# Patient Record
Sex: Female | Born: 1935 | Race: White | Hispanic: No | Marital: Married | Smoking: Never smoker
Health system: Southern US, Community
[De-identification: ages and names within clinical notes are randomized; demographics above are authoritative.]

## PROBLEM LIST (undated history)

## (undated) DIAGNOSIS — I509 Heart failure, unspecified: Secondary | ICD-10-CM

## (undated) DIAGNOSIS — C801 Malignant (primary) neoplasm, unspecified: Secondary | ICD-10-CM

## (undated) DIAGNOSIS — I10 Essential (primary) hypertension: Secondary | ICD-10-CM

## (undated) DIAGNOSIS — I219 Acute myocardial infarction, unspecified: Secondary | ICD-10-CM

## (undated) DIAGNOSIS — I4891 Unspecified atrial fibrillation: Secondary | ICD-10-CM

## (undated) HISTORY — PX: CARDIAC SURGERY: SHX584

## (undated) HISTORY — PX: ABDOMINAL HYSTERECTOMY: SHX81

---

## 2005-03-27 ENCOUNTER — Inpatient Hospital Stay: Payer: Self-pay | Admitting: Internal Medicine

## 2005-12-28 ENCOUNTER — Ambulatory Visit: Payer: Self-pay | Admitting: Family Medicine

## 2006-01-04 ENCOUNTER — Ambulatory Visit: Payer: Self-pay | Admitting: Family Medicine

## 2006-02-02 ENCOUNTER — Ambulatory Visit: Payer: Self-pay | Admitting: Ophthalmology

## 2006-02-09 ENCOUNTER — Ambulatory Visit: Payer: Self-pay | Admitting: Ophthalmology

## 2006-02-24 ENCOUNTER — Ambulatory Visit: Payer: Self-pay | Admitting: Ophthalmology

## 2006-03-02 ENCOUNTER — Ambulatory Visit: Payer: Self-pay | Admitting: Ophthalmology

## 2006-06-08 ENCOUNTER — Emergency Department: Payer: Self-pay | Admitting: Emergency Medicine

## 2006-06-08 ENCOUNTER — Other Ambulatory Visit: Payer: Self-pay

## 2009-09-11 ENCOUNTER — Emergency Department: Payer: Self-pay | Admitting: Emergency Medicine

## 2011-02-04 ENCOUNTER — Inpatient Hospital Stay: Payer: Self-pay | Admitting: Internal Medicine

## 2016-03-02 ENCOUNTER — Emergency Department
Admission: EM | Admit: 2016-03-02 | Discharge: 2016-03-02 | Disposition: A | Discharge status: Home / Self Care | Payer: Medicare Other | Attending: Emergency Medicine | Admitting: Emergency Medicine

## 2016-03-02 ENCOUNTER — Encounter: Payer: Self-pay | Admitting: Emergency Medicine

## 2016-03-02 ENCOUNTER — Emergency Department: Payer: Medicare Other

## 2016-03-02 DIAGNOSIS — I2510 Atherosclerotic heart disease of native coronary artery without angina pectoris: Secondary | ICD-10-CM | POA: Diagnosis not present

## 2016-03-02 DIAGNOSIS — R05 Cough: Secondary | ICD-10-CM | POA: Diagnosis present

## 2016-03-02 DIAGNOSIS — Z7982 Long term (current) use of aspirin: Secondary | ICD-10-CM | POA: Insufficient documentation

## 2016-03-02 DIAGNOSIS — J189 Pneumonia, unspecified organism: Secondary | ICD-10-CM | POA: Insufficient documentation

## 2016-03-02 DIAGNOSIS — I10 Essential (primary) hypertension: Secondary | ICD-10-CM | POA: Diagnosis not present

## 2016-03-02 DIAGNOSIS — R112 Nausea with vomiting, unspecified: Secondary | ICD-10-CM | POA: Insufficient documentation

## 2016-03-02 DIAGNOSIS — Z79899 Other long term (current) drug therapy: Secondary | ICD-10-CM | POA: Diagnosis not present

## 2016-03-02 HISTORY — DX: Unspecified atrial fibrillation: I48.91

## 2016-03-02 HISTORY — DX: Essential (primary) hypertension: I10

## 2016-03-02 LAB — CBC WITH DIFFERENTIAL/PLATELET
BASOS ABS: 0.1 10*3/uL (ref 0–0.1)
Basophils Relative: 0 %
EOS PCT: 1 %
Eosinophils Absolute: 0.2 10*3/uL (ref 0–0.7)
HEMATOCRIT: 39.8 % (ref 35.0–47.0)
Hemoglobin: 13.4 g/dL (ref 12.0–16.0)
LYMPHS ABS: 0.6 10*3/uL — AB (ref 1.0–3.6)
LYMPHS PCT: 4 %
MCH: 31.2 pg (ref 26.0–34.0)
MCHC: 33.8 g/dL (ref 32.0–36.0)
MCV: 92.3 fL (ref 80.0–100.0)
MONO ABS: 1 10*3/uL — AB (ref 0.2–0.9)
Monocytes Relative: 6 %
NEUTROS ABS: 14.4 10*3/uL — AB (ref 1.4–6.5)
Neutrophils Relative %: 89 %
PLATELETS: 246 10*3/uL (ref 150–440)
RBC: 4.32 MIL/uL (ref 3.80–5.20)
RDW: 15.3 % — AB (ref 11.5–14.5)
WBC: 16.2 10*3/uL — ABNORMAL HIGH (ref 3.6–11.0)

## 2016-03-02 LAB — BASIC METABOLIC PANEL
ANION GAP: 10 (ref 5–15)
BUN: 36 mg/dL — AB (ref 6–20)
CO2: 23 mmol/L (ref 22–32)
Calcium: 9.5 mg/dL (ref 8.9–10.3)
Chloride: 105 mmol/L (ref 101–111)
Creatinine, Ser: 0.61 mg/dL (ref 0.44–1.00)
GFR calc Af Amer: >60 mL/min (ref >60)
GLUCOSE: 128 mg/dL — AB (ref 65–99)
POTASSIUM: 4.2 mmol/L (ref 3.5–5.1)
Sodium: 138 mmol/L (ref 135–145)

## 2016-03-02 LAB — TROPONIN I
Troponin I: <0.03 ng/mL (ref <0.03)
Troponin I: <0.03 ng/mL (ref <0.03)

## 2016-03-02 MED ORDER — SODIUM CHLORIDE 0.9 % IV BOLUS (SEPSIS)
500.0000 mL | Freq: Once | INTRAVENOUS | Status: AC
  Administered 2016-03-02: 500 mL via INTRAVENOUS

## 2016-03-02 MED ORDER — ONDANSETRON 4 MG PO TBDP: 4.0000 mg | ORAL_TABLET | Freq: Three times a day (TID) | ORAL | 0 refills | Status: DC | PRN

## 2016-03-02 MED ORDER — HYDROCOD POLST-CPM POLST ER 10-8 MG/5ML PO SUER: 5.0000 mL | Freq: Every evening | ORAL | 0 refills | Status: DC | PRN

## 2016-03-02 MED ORDER — HYDROCOD POLST-CPM POLST ER 10-8 MG/5ML PO SUER
5.0000 mL | Freq: Once | ORAL | Status: AC
  Administered 2016-03-02: 5 mL via ORAL
  Filled 2016-03-02: qty 5

## 2016-03-02 MED ORDER — AMOXICILLIN-POT CLAVULANATE 875-125 MG PO TABS
1.0000 | ORAL_TABLET | Freq: Once | ORAL | Status: AC
  Administered 2016-03-02: 1 via ORAL
  Filled 2016-03-02: qty 1

## 2016-03-02 MED ORDER — ONDANSETRON HCL 4 MG/2ML IJ SOLN
4.0000 mg | Freq: Once | INTRAMUSCULAR | Status: AC
  Administered 2016-03-02: 4 mg via INTRAVENOUS
  Filled 2016-03-02: qty 2

## 2016-03-02 MED ORDER — AMOXICILLIN-POT CLAVULANATE 875-125 MG PO TABS: 1.0000 | ORAL_TABLET | Freq: Two times a day (BID) | ORAL | 0 refills | Status: AC

## 2016-03-02 MED ORDER — AZITHROMYCIN 500 MG PO TABS: 500.0000 mg | ORAL_TABLET | Freq: Once | ORAL | Status: DC

## 2016-03-02 NOTE — ED Provider Notes (Signed)
[facility] Emergency Department Provider Note  ____________________________________________  Time seen: Approximately 3:37 PM  I have reviewed the triage vital signs and the nursing notes.   HISTORY  Chief Complaint nausea and Chest Pain   HPI URSELA MORIAN is a 82 y.o. female with a history of atrial fibrillation, hypertension, CADstatus post PCI and stent in 2013 on Plavix who presents for evaluation of nausea and vomiting. Patient reports a few days of a severe cough productive of clear sputum. Today she was coming to the hospital for an echocardiogram this patient has been having dyspnea on exertion for multiple months. On route to the hospital patient started to feel dizzy like she was going to pass out. She also felt nauseous and had several episodes of nonbloody nonbilious emesis. Patient denies having chest pain. She denies CP and does report taking a sublingual nitroglycerin earlier today because she was nauseous. No worsening SOB, no CP, no fever/ chills, no abdominal pain, no dysuria, no diarrhea. Patient was in her usual state of health this morning when she woke up.  Past Medical History:  Diagnosis Date  . A-fib ([place])   . Hypertension     There are no active problems to display for this patient.   Past Surgical History:  Procedure Laterality Date  . ABDOMINAL HYSTERECTOMY      Prior to Admission medications   Medication Sig Start Date End Date Taking? Authorizing Provider  acetaminophen (TYLENOL) 325 MG tablet Take 650 mg by mouth every 6 (six) hours as needed.   Yes Historical Provider, MD  Ascorbic Acid (VITAMIN C) 500 MG CAPS Take 500 mg by mouth daily.   Yes Historical Provider, MD  aspirin 81 MG EC tablet Take 81 mg by mouth daily. 09/09/09  Yes Historical Provider, MD  atorvastatin (LIPITOR) 40 MG tablet Take 40 mg by mouth daily at 6 PM.  02/11/16  Yes Historical Provider, MD  clopidogrel (PLAVIX) 75 MG tablet Take 75 mg by  mouth daily.  02/11/16  Yes Historical Provider, MD  ferrous sulfate 325 (65 FE) MG tablet Take 325 mg by mouth daily with breakfast.   Yes Historical Provider, MD  lisinopril (PRINIVIL,ZESTRIL) 5 MG tablet 5 mg.  02/11/16  Yes Historical Provider, MD  metoprolol tartrate (LOPRESSOR) 25 MG tablet 25 mg 2 (two) times daily.  02/11/16  Yes Historical Provider, MD  pantoprazole (PROTONIX) 40 MG tablet  02/11/16  Yes Historical Provider, MD  vitamin E 400 UNIT capsule Take 400 Units by mouth daily.   Yes Historical Provider, MD  amoxicillin-clavulanate (AUGMENTIN) 875-125 MG tablet Take 1 tablet by mouth 2 (two) times daily. 03/02/16 03/12/16  Rudene Re, MD  chlorpheniramine-HYDROcodone [facility] ER) 10-8 MG/5ML SUER Take 5 mLs by mouth at bedtime as needed for cough. 03/02/16   Rudene Re, MD  ondansetron (ZOFRAN ODT) 4 MG disintegrating tablet Take 1 tablet (4 mg total) by mouth every 8 (eight) hours as needed for nausea or vomiting. 03/02/16   Rudene Re, MD    Allergies Demerol [meperidine]  No family history on file.  Social History Social History  Substance Use Topics  . Smoking status: Never Smoker  . Smokeless tobacco: Never Used  . Alcohol use No    Review of Systems  Constitutional: Negative for fever. Eyes: Negative for visual changes. ENT: Negative for sore throat. Neck: No neck pain  Cardiovascular: Negative for chest pain. Respiratory: + shortness of breath and cough Gastrointestinal: Negative for abdominal pain,  Diarrhea. + N/V Genitourinary: Negative for dysuria. Musculoskeletal: Negative for back pain. Skin: Negative for rash. Neurological: Negative for headaches, weakness or numbness. Psych: No SI or HI  ____________________________________________   PHYSICAL EXAM:  VITAL SIGNS: ED Triage Vitals  Enc Vitals Group     BP 03/02/16 1505 (!) 153/94     Pulse Rate 03/02/16 1505 78     Resp 03/02/16 1505 20     Temp 03/02/16  1505 98.4 F (36.9 C)     Temp Source 03/02/16 1505 Oral     SpO2 03/02/16 1505 93 %     Weight 03/02/16 1502 111 lb (50.3 kg)     Height 03/02/16 1502 4\' 8"  (1.422 m)     Head Circumference --      Peak Flow --      Pain Score 03/02/16 1502 0     Pain Loc --      Pain Edu? --      Excl. in [place]? --     Constitutional: Alert and oriented, patient wilt coughing fits, bringing up clear sputum.  HEENT:      Head: Normocephalic and atraumatic.         Eyes: Conjunctivae are normal. Sclera is non-icteric. EOMI. PERRL      Mouth/Throat: Mucous membranes are moist.       Neck: Supple with no signs of meningismus. Cardiovascular: Irregularly irregular rhythm with normal rate. No murmurs, gallops, or rubs. 2+ symmetrical distal pulses are present in all extremities. No JVD. Respiratory: Normal respiratory effort. Lungs are clear to auscultation bilaterally. No wheezes, crackles, or rhonchi.  Gastrointestinal: Soft, non tender, and non distended with positive bowel sounds. No rebound or guarding. Musculoskeletal: Nontender with normal range of motion in all extremities. No edema, cyanosis, or erythema of extremities. Neurologic: Normal speech and language. Face is symmetric. Moving all extremities. No gross focal neurologic deficits are appreciated. Skin: Skin is warm, dry and intact. No rash noted. Psychiatric: Mood and affect are normal. Speech and behavior are normal.  ____________________________________________   LABS (all labs ordered are listed, but only abnormal results are displayed)  Labs Reviewed  CBC WITH DIFFERENTIAL/PLATELET - Abnormal; Notable for the following:       Result Value   WBC 16.2 (*)    RDW 15.3 (*)    Neutro Abs 14.4 (*)    Lymphs Abs 0.6 (*)    Monocytes Absolute 1.0 (*)    All other components within normal limits  BASIC METABOLIC PANEL - Abnormal; Notable for the following:    Glucose, Bld 128 (*)    BUN 36 (*)    All other components within normal  limits  TROPONIN I  TROPONIN I  URINALYSIS COMPLETEWITH MICROSCOPIC ([facility] ONLY)   ____________________________________________  EKG  ED ECG REPORT I, Rudene Re, the attending physician, personally viewed and interpreted this ECG.  Atrial fibrillation, rate of 80, prolonged QTC at 507, right bundle branch block, T-wave inversions on inferior leads, no ST elevations. Unchanged from prior.  ____________________________________________  RADIOLOGY  CXR: Cardiomegaly without pulmonary edema. No pneumonia or pleural effusion or other acute cardiopulmonary abnormality.  Thoracic aortic atherosclerosis. ____________________________________________   PROCEDURES  Procedure(s) performed: None Procedures Critical Care performed:  None ____________________________________________   INITIAL IMPRESSION / ASSESSMENT AND PLAN / ED COURSE  82 y.o. female with a history of atrial fibrillation, hypertension, CADstatus post PCI and stent in 2013 on Plavix who presents for evaluation of nausea and vomiting since earlier today in the  setting of cough productive of clear sputum for a few weeks. Patient is having multiple coughing fits in the room but otherwise her lungs are clear, she is in A. fib with well-controlled rate, normal work of breathing, normal sats, abdomen is soft and nontender. EKG is unchanged from prior. We'll give her Tussionex for the cough, get a chest x-ray to rule out pneumonia, give her Zofran and fluids for the nausea and vomiting, check blood work and urinalysis.  Clinical Course as of Mar 02 1910  Tue Mar 02, 2016  1719 Nurse called because patient was hypoxic requiring 3L [place]. I went to evaluate patient. Lungs remain clear, I repositioned her in bed and patient sats are normal on RA. Patient was removed from supplemental oxygen. CXR clear however with cough and elevated WBC patient was started on augmentin for possible CA-PNA. 2nd troponin is due at 18:15. Plan to dc  home on augmentin, tussionex, and zofran  [CV]    Clinical Course User Index [CV] Rudene Re, MD    Pertinent labs & imaging results that were available during my care of the patient were reviewed by me and considered in my medical decision making (see chart for details).    ____________________________________________   FINAL CLINICAL IMPRESSION(S) / ED DIAGNOSES  Final diagnoses:  Community acquired pneumonia, unspecified laterality      NEW MEDICATIONS STARTED DURING THIS VISIT:  New Prescriptions   AMOXICILLIN-CLAVULANATE (AUGMENTIN) 875-125 MG TABLET    Take 1 tablet by mouth 2 (two) times daily.   CHLORPHENIRAMINE-HYDROCODONE (TUSSIONEX PENNKINETIC ER) 10-8 MG/5ML SUER    Take 5 mLs by mouth at bedtime as needed for cough.   ONDANSETRON (ZOFRAN ODT) 4 MG DISINTEGRATING TABLET    Take 1 tablet (4 mg total) by mouth every 8 (eight) hours as needed for nausea or vomiting.     Note:  This document was prepared using Dragon voice recognition software and may include unintentional dictation errors.    Rudene Re, MD 03/02/16 [phone]

## 2016-03-02 NOTE — ED Triage Notes (Signed)
Pt with c/o "feeling funny in her chest", denies any chest pain at present, pt states she feels dizzy and lightheaded.

## 2016-03-02 NOTE — ED Notes (Signed)
Pt desat'd to 86% - placed on 2L of O2 and brought to 88% - increased O2 to 3L via n/c and o2 sats 94-95% - Dr Alfred Levins notified - pt denies feeling short of breath - lung sounds are clear - cough relieved by medication - denies chest pain/pressure/tightness

## 2016-03-02 NOTE — ED Notes (Signed)
Pt came to hospital today for echo but got shaky and began to vomit so they would not do the test - pt has croupy cough since 11am and c/o nausea at same time - pt denies chest pain - reports shortness of breath with any exertion over the last few months - MD at bedside evaluating pt

## 2016-03-02 NOTE — Discharge Instructions (Signed)

## 2016-03-11 ENCOUNTER — Encounter: Payer: Self-pay | Admitting: Emergency Medicine

## 2016-03-11 ENCOUNTER — Emergency Department: Payer: Medicare Other

## 2016-03-11 ENCOUNTER — Emergency Department
Admission: EM | Admit: 2016-03-11 | Discharge: 2016-03-11 | Disposition: A | Discharge status: Home / Self Care | Payer: Medicare Other | Attending: Emergency Medicine | Admitting: Emergency Medicine

## 2016-03-11 DIAGNOSIS — R079 Chest pain, unspecified: Secondary | ICD-10-CM | POA: Diagnosis not present

## 2016-03-11 DIAGNOSIS — I10 Essential (primary) hypertension: Secondary | ICD-10-CM | POA: Insufficient documentation

## 2016-03-11 DIAGNOSIS — Z79899 Other long term (current) drug therapy: Secondary | ICD-10-CM | POA: Insufficient documentation

## 2016-03-11 DIAGNOSIS — R0602 Shortness of breath: Secondary | ICD-10-CM | POA: Diagnosis present

## 2016-03-11 DIAGNOSIS — Z7982 Long term (current) use of aspirin: Secondary | ICD-10-CM | POA: Diagnosis not present

## 2016-03-11 LAB — CBC WITH DIFFERENTIAL/PLATELET
BASOS PCT: 0 %
Basophils Absolute: 0.1 10*3/uL (ref 0–0.1)
EOS ABS: 0.1 10*3/uL (ref 0–0.7)
EOS PCT: 1 %
HCT: 39.5 % (ref 35.0–47.0)
HEMOGLOBIN: 13.2 g/dL (ref 12.0–16.0)
LYMPHS ABS: 0.6 10*3/uL — AB (ref 1.0–3.6)
Lymphocytes Relative: 4 %
MCH: 31.2 pg (ref 26.0–34.0)
MCHC: 33.4 g/dL (ref 32.0–36.0)
MCV: 93.5 fL (ref 80.0–100.0)
MONO ABS: 1.8 10*3/uL — AB (ref 0.2–0.9)
MONOS PCT: 13 %
Neutro Abs: 11.4 10*3/uL — ABNORMAL HIGH (ref 1.4–6.5)
Neutrophils Relative %: 82 %
PLATELETS: 262 10*3/uL (ref 150–440)
RBC: 4.22 MIL/uL (ref 3.80–5.20)
RDW: 14.7 % — AB (ref 11.5–14.5)
WBC: 14 10*3/uL — ABNORMAL HIGH (ref 3.6–11.0)

## 2016-03-11 LAB — BASIC METABOLIC PANEL
Anion gap: 9 (ref 5–15)
BUN: 23 mg/dL — AB (ref 6–20)
CALCIUM: 9.1 mg/dL (ref 8.9–10.3)
CHLORIDE: 100 mmol/L — AB (ref 101–111)
CO2: 27 mmol/L (ref 22–32)
CREATININE: 0.61 mg/dL (ref 0.44–1.00)
GFR calc Af Amer: >60 mL/min (ref >60)
GFR calc non Af Amer: >60 mL/min (ref >60)
Glucose, Bld: 119 mg/dL — ABNORMAL HIGH (ref 65–99)
Potassium: 4.1 mmol/L (ref 3.5–5.1)
SODIUM: 136 mmol/L (ref 135–145)

## 2016-03-11 LAB — TROPONIN I: Troponin I: <0.03 ng/mL (ref <0.03)

## 2016-03-11 LAB — BRAIN NATRIURETIC PEPTIDE: B Natriuretic Peptide: 336 pg/mL — ABNORMAL HIGH (ref 0.0–100.0)

## 2016-03-11 MED ORDER — FUROSEMIDE 10 MG/ML IJ SOLN
20.0000 mg | Freq: Once | INTRAMUSCULAR | Status: AC
  Administered 2016-03-11: 20 mg via INTRAVENOUS
  Filled 2016-03-11: qty 4

## 2016-03-11 MED ORDER — FUROSEMIDE 20 MG PO TABS: 20.0000 mg | ORAL_TABLET | Freq: Every day | ORAL | 0 refills | Status: AC

## 2016-03-11 NOTE — ED Notes (Signed)
Daughter has left bedside, states she will return

## 2016-03-11 NOTE — ED Notes (Signed)
Pt was seen last week for poss pneumonia, given antibiotics. Pt  c/o rib cage pain radiating to back on inspiration. Pt A&Ox4, denies any fevers.

## 2016-03-11 NOTE — ED Provider Notes (Signed)
[facility] Emergency Department Provider Note   ____________________________________________   I have reviewed the triage vital signs and the nursing notes.   HISTORY  Chief Complaint Chest Pain and Shortness of Breath   History limited by: Not Limited   HPI Mallory Rios is a 82 y.o. female who presents to the emergency department today via EMS because of concern for chest pain. It is located on the left side. It started a little over 12 hours ago and has been fairly constant. She describes it as sharp. There is radiation to her back. She tried taking some nitroglycerin without any significant relief. In addition the patient has a history of atrial fibrillation and feels like it was acting up today. She did have some shortness of breath when that occurred. The patient was seen in the emergency department roughly 10 days ago and diagnosed with pneumonia.   Past Medical History:  Diagnosis Date  . A-fib ([place])   . Hypertension     There are no active problems to display for this patient.   Past Surgical History:  Procedure Laterality Date  . ABDOMINAL HYSTERECTOMY      Prior to Admission medications   Medication Sig Start Date End Date Taking? Authorizing Provider  acetaminophen (TYLENOL) 325 MG tablet Take 650 mg by mouth every 6 (six) hours as needed.    Historical Provider, MD  amoxicillin-clavulanate (AUGMENTIN) 875-125 MG tablet Take 1 tablet by mouth 2 (two) times daily. 03/02/16 03/12/16  Rudene Re, MD  Ascorbic Acid (VITAMIN C) 500 MG CAPS Take 500 mg by mouth daily.    Historical Provider, MD  aspirin 81 MG EC tablet Take 81 mg by mouth daily. 09/09/09   Historical Provider, MD  atorvastatin (LIPITOR) 40 MG tablet Take 40 mg by mouth daily at 6 PM.  02/11/16   Historical Provider, MD  chlorpheniramine-HYDROcodone (TUSSIONEX PENNKINETIC ER) 10-8 MG/5ML SUER Take 5 mLs by mouth at bedtime as needed for cough. 03/02/16   Rudene Re, MD  clopidogrel (PLAVIX) 75 MG tablet Take 75 mg by mouth daily.  02/11/16   Historical Provider, MD  ferrous sulfate 325 (65 FE) MG tablet Take 325 mg by mouth daily with breakfast.    Historical Provider, MD  lisinopril (PRINIVIL,ZESTRIL) 5 MG tablet 5 mg.  02/11/16   Historical Provider, MD  metoprolol tartrate (LOPRESSOR) 25 MG tablet 25 mg 2 (two) times daily.  02/11/16   Historical Provider, MD  ondansetron (ZOFRAN ODT) 4 MG disintegrating tablet Take 1 tablet (4 mg total) by mouth every 8 (eight) hours as needed for nausea or vomiting. 03/02/16   Rudene Re, MD  pantoprazole (PROTONIX) 40 MG tablet  02/11/16   Historical Provider, MD  vitamin E 400 UNIT capsule Take 400 Units by mouth daily.    Historical Provider, MD    Allergies Demerol [meperidine]  No family history on file.  Social History Social History  Substance Use Topics  . Smoking status: Never Smoker  . Smokeless tobacco: Never Used  . Alcohol use No    Review of Systems  Constitutional: Negative for fever. Cardiovascular: Negative for chest pain. Respiratory: Negative for shortness of breath. Gastrointestinal: Negative for abdominal pain, vomiting and diarrhea. Genitourinary: Negative for dysuria. Musculoskeletal: Negative for back pain. Skin: Negative for rash. Neurological: Negative for headaches, focal weakness or numbness.  10-point ROS otherwise negative.  ____________________________________________   PHYSICAL EXAM:  VITAL SIGNS: ED Triage Vitals  Enc Vitals Group     BP  03/11/16 1518 (!) 127/107     Pulse Rate 03/11/16 1518 86     Resp 03/11/16 1518 (!) 24     Temp 03/11/16 1518 98.3 F (36.8 C)     Temp Source 03/11/16 1518 Oral     SpO2 03/11/16 1518 95 %     Weight 03/11/16 1519 111 lb (50.3 kg)     Height 03/11/16 1519 4\' 11"  (1.499 m)     Head Circumference --      Peak Flow --      Pain Score 03/11/16 1516 8     Pain Loc --      Pain Edu? --      Excl. in [place]?  --      Constitutional: Alert and oriented. Well appearing and in no distress. Eyes: Conjunctivae are normal. Normal extraocular movements. ENT   Head: Normocephalic and atraumatic.   Nose: No congestion/rhinnorhea.   Mouth/Throat: Mucous membranes are moist.   Neck: No stridor. Hematological/Lymphatic/Immunilogical: No cervical lymphadenopathy. Cardiovascular: Normal rate, regular rhythm.  No murmurs, rubs, or gallops.  Respiratory: Normal respiratory effort without tachypnea nor retractions. Breath sounds are clear and equal bilaterally. No wheezes/rales/rhonchi. Gastrointestinal: Soft and non tender. No rebound. No guarding.  Genitourinary: Deferred Musculoskeletal: Normal range of motion in all extremities. No lower extremity edema. Neurologic:  Normal speech and language. No gross focal neurologic deficits are appreciated.  Skin:  Skin is warm, dry and intact. No rash noted. Psychiatric: Mood and affect are normal. Speech and behavior are normal. Patient exhibits appropriate insight and judgment.  ____________________________________________    LABS (pertinent positives/negatives)  Labs Reviewed  CBC WITH DIFFERENTIAL/PLATELET - Abnormal; Notable for the following:       Result Value   WBC 14.0 (*)    RDW 14.7 (*)    Neutro Abs 11.4 (*)    Lymphs Abs 0.6 (*)    Monocytes Absolute 1.8 (*)    All other components within normal limits  BASIC METABOLIC PANEL - Abnormal; Notable for the following:    Chloride 100 (*)    Glucose, Bld 119 (*)    BUN 23 (*)    All other components within normal limits  BRAIN NATRIURETIC PEPTIDE - Abnormal; Notable for the following:    B Natriuretic Peptide 336.0 (*)    All other components within normal limits  TROPONIN I  TROPONIN I    ____________________________________________   EKG  I, Nance Pear, attending physician, personally viewed and interpreted this EKG  EKG Time: 1518 Rate: 95 Rhythm: atrial  fibrillation Axis: right axis deviation Intervals: qtc 472 QRS: nonspecific intraventricular conduction delay ST changes: no st elevation Impression: abnormal ekg   ____________________________________________    RADIOLOGY  CXR   IMPRESSION:  1. Cardiomegaly.  2. Slightly more prominent interstitial markings. Question mild  interstitial edema with questionable small left effusion.  3. Hiatal hernia.    ____________________________________________   PROCEDURES  Procedures  ____________________________________________   INITIAL IMPRESSION / ASSESSMENT AND PLAN / ED COURSE  Pertinent labs & imaging results that were available during my care of the patient were reviewed by me and considered in my medical decision making (see chart for details).  Patient presented to the emergency department today because of concerns for chest pain. 2 troponins here were negative. Chest x-ray shows some possible pulmonary edema. Patient was given a dose of IV Lasix here. Patient did state that she felt a little bit better on reassessment after the Lasix. The patient stated she  felt comfortable going home. Discussed that we will give prescription for lasix for the next couple of days. Encouraged patient to follow up with PCP.  ____________________________________________   FINAL CLINICAL IMPRESSION(S) / ED DIAGNOSES  Final diagnoses:  Chest pain, unspecified type     Note: This dictation was prepared with Dragon dictation. Any transcriptional errors that result from this process are unintentional     Nance Pear, MD 03/11/16 1939

## 2016-03-11 NOTE — ED Notes (Signed)
ED Provider at bedside. 

## 2016-03-11 NOTE — Discharge Instructions (Signed)
Please seek medical attention for any high fevers, chest pain, shortness of breath, change in behavior, persistent vomiting, bloody stool or any other new or concerning symptoms.  

## 2016-06-01 ENCOUNTER — Inpatient Hospital Stay
Admission: EM | Admit: 2016-06-01 | Discharge: 2016-06-05 | DRG: 293 | Disposition: A | Discharge status: Home / Self Care | Payer: Medicare Other | Attending: Internal Medicine | Admitting: Internal Medicine

## 2016-06-01 ENCOUNTER — Emergency Department: Payer: Medicare Other

## 2016-06-01 ENCOUNTER — Encounter: Payer: Self-pay | Admitting: Emergency Medicine

## 2016-06-01 ENCOUNTER — Inpatient Hospital Stay: Admit: 2016-06-01 | Payer: Medicare Other

## 2016-06-01 DIAGNOSIS — I959 Hypotension, unspecified: Secondary | ICD-10-CM | POA: Diagnosis present

## 2016-06-01 DIAGNOSIS — Z955 Presence of coronary angioplasty implant and graft: Secondary | ICD-10-CM

## 2016-06-01 DIAGNOSIS — Z90710 Acquired absence of both cervix and uterus: Secondary | ICD-10-CM | POA: Diagnosis not present

## 2016-06-01 DIAGNOSIS — I2510 Atherosclerotic heart disease of native coronary artery without angina pectoris: Secondary | ICD-10-CM | POA: Diagnosis present

## 2016-06-01 DIAGNOSIS — R002 Palpitations: Secondary | ICD-10-CM | POA: Diagnosis present

## 2016-06-01 DIAGNOSIS — Z79899 Other long term (current) drug therapy: Secondary | ICD-10-CM

## 2016-06-01 DIAGNOSIS — I509 Heart failure, unspecified: Secondary | ICD-10-CM | POA: Diagnosis present

## 2016-06-01 DIAGNOSIS — I252 Old myocardial infarction: Secondary | ICD-10-CM

## 2016-06-01 DIAGNOSIS — I5033 Acute on chronic diastolic (congestive) heart failure: Secondary | ICD-10-CM | POA: Diagnosis present

## 2016-06-01 DIAGNOSIS — I083 Combined rheumatic disorders of mitral, aortic and tricuspid valves: Secondary | ICD-10-CM | POA: Diagnosis present

## 2016-06-01 DIAGNOSIS — K219 Gastro-esophageal reflux disease without esophagitis: Secondary | ICD-10-CM | POA: Diagnosis present

## 2016-06-01 DIAGNOSIS — I4891 Unspecified atrial fibrillation: Secondary | ICD-10-CM | POA: Diagnosis present

## 2016-06-01 DIAGNOSIS — Z7982 Long term (current) use of aspirin: Secondary | ICD-10-CM

## 2016-06-01 DIAGNOSIS — I110 Hypertensive heart disease with heart failure: Secondary | ICD-10-CM | POA: Diagnosis present

## 2016-06-01 DIAGNOSIS — Z888 Allergy status to other drugs, medicaments and biological substances status: Secondary | ICD-10-CM | POA: Diagnosis not present

## 2016-06-01 HISTORY — DX: Acute myocardial infarction, unspecified: I21.9

## 2016-06-01 HISTORY — DX: Malignant (primary) neoplasm, unspecified: C80.1

## 2016-06-01 LAB — BASIC METABOLIC PANEL
Anion gap: 8 (ref 5–15)
BUN: 27 mg/dL — AB (ref 6–20)
CHLORIDE: 106 mmol/L (ref 101–111)
CO2: 25 mmol/L (ref 22–32)
CREATININE: 0.71 mg/dL (ref 0.44–1.00)
Calcium: 8.9 mg/dL (ref 8.9–10.3)
Glucose, Bld: 105 mg/dL — ABNORMAL HIGH (ref 65–99)
Potassium: 4.6 mmol/L (ref 3.5–5.1)
SODIUM: 139 mmol/L (ref 135–145)

## 2016-06-01 LAB — TROPONIN I
Troponin I: <0.03 ng/mL (ref <0.03)
Troponin I: <0.03 ng/mL (ref <0.03)

## 2016-06-01 LAB — CBC
HCT: 36.6 % (ref 35.0–47.0)
Hemoglobin: 12.1 g/dL (ref 12.0–16.0)
MCH: 30 pg (ref 26.0–34.0)
MCHC: 33.2 g/dL (ref 32.0–36.0)
MCV: 90.4 fL (ref 80.0–100.0)
PLATELETS: 250 10*3/uL (ref 150–440)
RBC: 4.05 MIL/uL (ref 3.80–5.20)
RDW: 16.5 % — AB (ref 11.5–14.5)
WBC: 9.2 10*3/uL (ref 3.6–11.0)

## 2016-06-01 LAB — GLUCOSE, CAPILLARY: GLUCOSE-CAPILLARY: 116 mg/dL — AB (ref 65–99)

## 2016-06-01 LAB — BRAIN NATRIURETIC PEPTIDE: B Natriuretic Peptide: 441 pg/mL — ABNORMAL HIGH (ref 0.0–100.0)

## 2016-06-01 MED ORDER — FUROSEMIDE 10 MG/ML IJ SOLN
40.0000 mg | Freq: Two times a day (BID) | INTRAMUSCULAR | Status: DC
  Administered 2016-06-01 – 2016-06-02 (×2): 40 mg via INTRAVENOUS
  Filled 2016-06-01 (×2): qty 4

## 2016-06-01 MED ORDER — LISINOPRIL 5 MG PO TABS
5.0000 mg | ORAL_TABLET | Freq: Every day | ORAL | Status: DC
  Administered 2016-06-01 – 2016-06-02 (×2): 5 mg via ORAL
  Filled 2016-06-01 (×2): qty 1

## 2016-06-01 MED ORDER — ENOXAPARIN SODIUM 40 MG/0.4ML ~~LOC~~ SOLN
40.0000 mg | SUBCUTANEOUS | Status: DC
  Administered 2016-06-01 – 2016-06-04 (×4): 40 mg via SUBCUTANEOUS
  Filled 2016-06-01 (×4): qty 0.4

## 2016-06-01 MED ORDER — CLOPIDOGREL BISULFATE 75 MG PO TABS
75.0000 mg | ORAL_TABLET | Freq: Every day | ORAL | Status: DC
  Administered 2016-06-01 – 2016-06-05 (×5): 75 mg via ORAL
  Filled 2016-06-01 (×5): qty 1

## 2016-06-01 MED ORDER — SODIUM CHLORIDE 0.9 % IV SOLN: 250.0000 mL | INTRAVENOUS | Status: DC | PRN

## 2016-06-01 MED ORDER — ASPIRIN 81 MG PO TBEC: 81.0000 mg | DELAYED_RELEASE_TABLET | Freq: Every day | ORAL | Status: DC

## 2016-06-01 MED ORDER — ASPIRIN EC 81 MG PO TBEC
81.0000 mg | DELAYED_RELEASE_TABLET | Freq: Every day | ORAL | Status: DC
Start: 2016-06-01 — End: 2016-06-05
  Administered 2016-06-01 – 2016-06-05 (×5): 81 mg via ORAL
  Filled 2016-06-01 (×5): qty 1

## 2016-06-01 MED ORDER — ACETAMINOPHEN 500 MG PO TABS
1000.0000 mg | ORAL_TABLET | Freq: Two times a day (BID) | ORAL | Status: DC
  Administered 2016-06-02 – 2016-06-05 (×7): 1000 mg via ORAL
  Filled 2016-06-01 (×8): qty 2

## 2016-06-01 MED ORDER — FAMOTIDINE 20 MG PO TABS
20.0000 mg | ORAL_TABLET | Freq: Every day | ORAL | Status: DC
  Administered 2016-06-01 – 2016-06-04 (×4): 20 mg via ORAL
  Filled 2016-06-01 (×4): qty 1

## 2016-06-01 MED ORDER — ATORVASTATIN CALCIUM 20 MG PO TABS
40.0000 mg | ORAL_TABLET | Freq: Every day | ORAL | Status: DC
  Administered 2016-06-01 – 2016-06-04 (×4): 40 mg via ORAL
  Filled 2016-06-01 (×4): qty 2

## 2016-06-01 MED ORDER — SODIUM CHLORIDE 0.9% FLUSH
3.0000 mL | Freq: Two times a day (BID) | INTRAVENOUS | Status: DC
  Administered 2016-06-01 – 2016-06-05 (×9): 3 mL via INTRAVENOUS

## 2016-06-01 MED ORDER — VITAMIN E 180 MG (400 UNIT) PO CAPS
400.0000 [IU] | ORAL_CAPSULE | Freq: Every day | ORAL | Status: DC
  Administered 2016-06-01 – 2016-06-05 (×5): 400 [IU] via ORAL
  Filled 2016-06-01 (×5): qty 1

## 2016-06-01 MED ORDER — FERROUS SULFATE 325 (65 FE) MG PO TABS
325.0000 mg | ORAL_TABLET | Freq: Every day | ORAL | Status: DC
  Administered 2016-06-04 – 2016-06-05 (×2): 325 mg via ORAL
  Filled 2016-06-01 (×5): qty 1

## 2016-06-01 MED ORDER — VITAMIN C 500 MG PO TABS
500.0000 mg | ORAL_TABLET | Freq: Every day | ORAL | Status: DC
  Administered 2016-06-01 – 2016-06-05 (×5): 500 mg via ORAL
  Filled 2016-06-01 (×5): qty 1

## 2016-06-01 MED ORDER — ONDANSETRON HCL 4 MG/2ML IJ SOLN: 4.0000 mg | Freq: Four times a day (QID) | INTRAMUSCULAR | Status: DC | PRN

## 2016-06-01 MED ORDER — ACETAMINOPHEN 325 MG PO TABS
650.0000 mg | ORAL_TABLET | ORAL | Status: DC | PRN
  Administered 2016-06-01: 650 mg via ORAL
  Filled 2016-06-01: qty 2

## 2016-06-01 MED ORDER — ACETAMINOPHEN 325 MG PO TABS
650.0000 mg | ORAL_TABLET | Freq: Two times a day (BID) | ORAL | Status: DC
  Administered 2016-06-01: 650 mg via ORAL
  Filled 2016-06-01: qty 2

## 2016-06-01 MED ORDER — NITROGLYCERIN 0.4 MG SL SUBL
0.4000 mg | SUBLINGUAL_TABLET | SUBLINGUAL | Status: DC | PRN
Start: 2016-06-01 — End: 2016-06-05

## 2016-06-01 MED ORDER — PANTOPRAZOLE SODIUM 40 MG PO TBEC
40.0000 mg | DELAYED_RELEASE_TABLET | Freq: Every day | ORAL | Status: DC
  Administered 2016-06-01: 40 mg via ORAL
  Filled 2016-06-01: qty 1

## 2016-06-01 MED ORDER — METOPROLOL TARTRATE 25 MG PO TABS
25.0000 mg | ORAL_TABLET | Freq: Two times a day (BID) | ORAL | Status: DC
  Administered 2016-06-01 – 2016-06-02 (×3): 25 mg via ORAL
  Filled 2016-06-01 (×3): qty 1

## 2016-06-01 MED ORDER — SODIUM CHLORIDE 0.9% FLUSH: 3.0000 mL | INTRAVENOUS | Status: DC | PRN

## 2016-06-01 MED ORDER — FUROSEMIDE 10 MG/ML IJ SOLN
40.0000 mg | Freq: Once | INTRAMUSCULAR | Status: AC
  Administered 2016-06-01: 40 mg via INTRAVENOUS
  Filled 2016-06-01: qty 4

## 2016-06-01 MED ORDER — PANTOPRAZOLE SODIUM 40 MG PO TBEC
40.0000 mg | DELAYED_RELEASE_TABLET | Freq: Two times a day (BID) | ORAL | Status: DC
  Administered 2016-06-01 – 2016-06-05 (×8): 40 mg via ORAL
  Filled 2016-06-01 (×8): qty 1

## 2016-06-01 NOTE — ED Notes (Signed)
Admitting md at bedside

## 2016-06-01 NOTE — Clinical Social Work Note (Signed)
CSW received consult that patient may need SNF for short term rehab.  CSW awaiting PT evaluation and recommendation.  Jones Broom. [place], MSW, [place]  06/01/2016 12:18 PM

## 2016-06-01 NOTE — Progress Notes (Signed)
RR increased to 44 with activity of getting up to the commode directly next to her bed.  O2 sats at 94%

## 2016-06-01 NOTE — Progress Notes (Signed)
[place] [facility] [place] NAME: Mallory Rios    MR#:  [id number]  DATE OF BIRTH:  06-08-1927  SUBJECTIVE:    REVIEW OF SYSTEMS:   ROS Tolerating Diet: Tolerating PT:   DRUG ALLERGIES:   Allergies  Allergen Reactions  . Demerol [Meperidine]   . Other Other (See Comments)  . Propoxyphene     VITALS:  Blood pressure (!) 124/50, pulse 75, temperature 98.4 F (36.9 C), temperature source Oral, resp. rate 16, height 4\' 8"  (1.422 m), weight 53 kg (116 lb 14.4 oz), SpO2 93 %.  PHYSICAL EXAMINATION:   Physical Exam  GENERAL:  83 y.o.-year-old patient lying in the bed with no acute distress.  EYES: Pupils equal, round, reactive to light and accommodation. No scleral icterus. Extraocular muscles intact.  HEENT: Head atraumatic, normocephalic. Oropharynx and nasopharynx clear.  NECK:  Supple, no jugular venous distention. No thyroid enlargement, no tenderness.  LUNGS: Normal breath sounds bilaterally, no wheezing, rales, rhonchi. No use of accessory muscles of respiration.  CARDIOVASCULAR: S1, S2 normal. No murmurs, rubs, or gallops.  ABDOMEN: Soft, nontender, nondistended. Bowel sounds present. No organomegaly or mass.  EXTREMITIES: No cyanosis, clubbing or edema b/l.    NEUROLOGIC: Cranial nerves II through XII are intact. No focal Motor or sensory deficits b/l.   PSYCHIATRIC:  patient is alert and oriented x 3.  SKIN: No obvious rash, lesion, or ulcer.   LABORATORY PANEL:  CBC  Recent Labs Lab 06/01/16 0319  WBC 9.2  HGB 12.1  HCT 36.6  PLT 250    Chemistries   Recent Labs Lab 06/01/16 0319  NA 139  K 4.6  CL 106  CO2 25  GLUCOSE 105*  BUN 27*  CREATININE 0.71  CALCIUM 8.9   Cardiac Enzymes  Recent Labs Lab 06/01/16 1336  TROPONINI <0.03   RADIOLOGY:  Dg Chest 2 View  Result Date: 06/01/2016 CLINICAL DATA:  Palpitations and dyspnea tonight. EXAM: CHEST  2 VIEW COMPARISON:  03/11/2016 FINDINGS:  Stable cardiomegaly and hyperinflation. There are small pleural effusions bilaterally, new. Mild interstitial prominence may represent a degree of interstitial edema. No confluent airspace opacities. Hiatal hernia noted. IMPRESSION: Stable hyperinflation and cardiomegaly, with superimposed interstitial edema and small pleural effusions. Electronically Signed   By: Andreas Newport M.D.   On: 06/01/2016 03:53   ASSESSMENT AND PLAN:  83 year old elderly female patient with history of atrial fibrillation, hypertension, coronary artery disease, myocardial infarction presented to the emergency room with increased shortness of breath and swelling in both lower extremities. Chest x-ray revealed vascular congestion and edema.  1. Acute congestive heart failure -ECHO pending. EF unknown -IV lasix, I and O and monitor creat  2. Dyspnea secondary to heart failure  3. Atrial fibrillation, chornic appears to be stable -cont BB  4. Hypertension On BB, lisinopril  5. H/o CAD  Cont plavix and statins  6. GERD  -PPI bid and zantac at bedtime  Case discussed with Care Management/Social Worker. Management plans discussed with the patient, family and they are in agreement.  CODE STATUS: Full  DVT Prophylaxis: lovenox TOTAL TIME TAKING CARE OF THIS PATIENT:30 minutes.  >50% time spent on counselling and coordination of care  POSSIBLE D/C IN 1-2DAYS, DEPENDING ON CLINICAL CONDITION.  Note: This dictation was prepared with Dragon dictation along with smaller phrase technology. Any transcriptional errors that result from this process are unintentional.  Makalya Nave M.D on 06/01/2016 at 5:44 PM  Between 7am to 6pm -  Pager - [phone]  After 6pm go to [email or web address] - password [email or web address] [facility]  Office  [phone]  CC: Primary care physician; No PCP Per Patient

## 2016-06-01 NOTE — ED Provider Notes (Signed)
[facility] Emergency Department Provider Note  ____________________________________________   First MD Initiated Contact with Patient 06/01/16 [phone]     (approximate)  I have reviewed the triage vital signs and the nursing notes.   HISTORY  Chief Complaint Atrial Fibrillation   HPI Mallory Rios is a 83 y.o. female with a history of myocardial infarction as well as atrial fibrillation was presenting to the emergency department today with 1 week of worsening shortness of breath and bilateral lower extremity swelling. She is also complaining of palpitations which were ongoing throughout the course of the day today. She took her metoprolol later than normal at 2 AM this morning and says that the palpitations have resolved but the shortness of breath and bilateral lower extremity swelling has persisted. Denying any chest pain at this time.   Past Medical History:  Diagnosis Date  . A-fib ([place])   . Cancer ([place])   . Hypertension   . MI (myocardial infarction)     There are no active problems to display for this patient.   Past Surgical History:  Procedure Laterality Date  . ABDOMINAL HYSTERECTOMY    . CARDIAC SURGERY     stents x6    Prior to Admission medications   Medication Sig Start Date End Date Taking? Authorizing Provider  acetaminophen (TYLENOL) 325 MG tablet Take 650 mg by mouth every 6 (six) hours as needed.    Historical Provider, MD  Ascorbic Acid (VITAMIN C) 500 MG CAPS Take 500 mg by mouth daily.    Historical Provider, MD  aspirin 81 MG EC tablet Take 81 mg by mouth daily. 09/09/09   Historical Provider, MD  atorvastatin (LIPITOR) 40 MG tablet Take 40 mg by mouth daily at 6 PM.  02/11/16   Historical Provider, MD  chlorpheniramine-HYDROcodone (TUSSIONEX PENNKINETIC ER) 10-8 MG/5ML SUER Take 5 mLs by mouth at bedtime as needed for cough. Patient not taking: Reported on 03/11/2016 03/02/16   Rudene Re, MD  clopidogrel (PLAVIX)  75 MG tablet Take 75 mg by mouth daily.  02/11/16   Historical Provider, MD  ferrous sulfate 325 (65 FE) MG tablet Take 325 mg by mouth daily with breakfast.    Historical Provider, MD  furosemide (LASIX) 20 MG tablet Take 1 tablet (20 mg total) by mouth daily. 03/11/16 03/11/17  Nance Pear, MD  lisinopril (PRINIVIL,ZESTRIL) 5 MG tablet 5 mg.  02/11/16   Historical Provider, MD  metoprolol tartrate (LOPRESSOR) 25 MG tablet 25 mg 2 (two) times daily.  02/11/16   Historical Provider, MD  ondansetron (ZOFRAN ODT) 4 MG disintegrating tablet Take 1 tablet (4 mg total) by mouth every 8 (eight) hours as needed for nausea or vomiting. 03/02/16   Rudene Re, MD  pantoprazole (PROTONIX) 40 MG tablet Take 40 mg by mouth daily.  02/11/16   Historical Provider, MD  vitamin E 400 UNIT capsule Take 400 Units by mouth daily.    Historical Provider, MD    Allergies Demerol [meperidine] and Propoxyphene  No family history on file.  Social History Social History  Substance Use Topics  . Smoking status: Never Smoker  . Smokeless tobacco: Never Used  . Alcohol use No    Review of Systems Constitutional: No fever/chills Eyes: No visual changes. ENT: No sore throat. Cardiovascular: Denies chest pain. Respiratory: as above Gastrointestinal: No abdominal pain.  No nausea, no vomiting.   Genitourinary: Negative for dysuria. Musculoskeletal: Negative for back pain. Skin: Negative for rash. Neurological: Negative for headaches, focal  weakness or numbness.  10-point ROS otherwise negative.  ____________________________________________   PHYSICAL EXAM:  VITAL SIGNS: ED Triage Vitals  Enc Vitals Group     BP 06/01/16 0316 131/89     Pulse Rate 06/01/16 0316 (!) 115     Resp 06/01/16 0316 (!) 36     Temp 06/01/16 0317 97.9 F (36.6 C)     Temp src --      SpO2 06/01/16 0316 93 %     Weight 06/01/16 0317 120 lb 9.5 oz (54.7 kg)     Height 06/01/16 0317 4\' 8"  (1.422 m)     Head  Circumference --      Peak Flow --      Pain Score --      Pain Loc --      Pain Edu? --      Excl. in [place]? --     Constitutional: Alert and oriented. Well appearing and in no acute distress. Eyes: Conjunctivae are normal. PERRL. EOMI. Head: Atraumatic. Nose: No congestion/rhinnorhea. Mouth/Throat: Mucous membranes are moist.  Oropharynx non-erythematous. Neck: No stridor.   Cardiovascular: Normal rate with an irregularly irregular rhythm. Grossly normal heart sounds.  Good peripheral circulation. Respiratory: Tachypnea but able to speak in full sentences. Rales to the bilateral bases. No retractions. Gastrointestinal: Soft and nontender. No distention.  Musculoskeletal: Mild to moderate bilateral lower extremity edema to the bilateral feet and calves. Neurologic:  Normal speech and language. No gross focal neurologic deficits are appreciated.  Skin:  Skin is warm, dry and intact. No rash noted. Psychiatric: Mood and affect are normal. Speech and behavior are normal.  ____________________________________________   LABS (all labs ordered are listed, but only abnormal results are displayed)  Labs Reviewed  BASIC METABOLIC PANEL - Abnormal; Notable for the following:       Result Value   Glucose, Bld 105 (*)    BUN 27 (*)    All other components within normal limits  CBC - Abnormal; Notable for the following:    RDW 16.5 (*)    All other components within normal limits  BRAIN NATRIURETIC PEPTIDE - Abnormal; Notable for the following:    B Natriuretic Peptide 441.0 (*)    All other components within normal limits  GLUCOSE, CAPILLARY - Abnormal; Notable for the following:    Glucose-Capillary 116 (*)    All other components within normal limits  TROPONIN I  CBG MONITORING, ED   ____________________________________________  EKG  ED ECG REPORT I, Doran Stabler, the attending physician, personally viewed and interpreted this ECG.   Date: 06/01/2016  EKG Time: 0315   Rate: 86  Rhythm: atrial fibrillation, rate 86  Axis: Normal  Intervals: Incomplete right bundle-branch block.  ST&T Change: No ST segment elevation or depression. No abnormal T-wave inversions. No significant change from previous EKG on the record from 03/11/2016. ____________________________________________  RADIOLOGY    DG Chest 2 View (Final result)  Result time 06/01/16 03:53:38  Final result by Delphina Cahill, MD (06/01/16 03:53:38)           Narrative:   CLINICAL DATA: Palpitations and dyspnea tonight.  EXAM: CHEST 2 VIEW  COMPARISON: 03/11/2016  FINDINGS: Stable cardiomegaly and hyperinflation. There are small pleural effusions bilaterally, new. Mild interstitial prominence may represent a degree of interstitial edema. No confluent airspace opacities. Hiatal hernia noted.  IMPRESSION: Stable hyperinflation and cardiomegaly, with superimposed interstitial edema and small pleural effusions.   Electronically Signed By: Andreas Newport M.D. On:  06/01/2016 03:53          ____________________________________________   PROCEDURES  Procedure(s) performed:   Procedures  Critical Care performed:   ____________________________________________   INITIAL IMPRESSION / ASSESSMENT AND PLAN / ED COURSE  Pertinent labs & imaging results that were available during my care of the patient were reviewed by me and considered in my medical decision making (see chart for details).  ----------------------------------------- 4:47 AM on 06/01/2016 -----------------------------------------  Patient says that her breathing is improved but still breathing a respiratory rate of 30. Will be admitted to the hospital for further diuresis. A splint is to the patient and she is understanding and willing to comply. Signed out to Dr. Tracie Harrier.       ____________________________________________   FINAL CLINICAL IMPRESSION(S) / ED DIAGNOSES  Shortness of breath  and CHF exacerbation.    NEW MEDICATIONS STARTED DURING THIS VISIT:  New Prescriptions   No medications on file     Note:  This document was prepared using Dragon voice recognition software and may include unintentional dictation errors.    Orbie Pyo, MD 06/01/16 [phone]

## 2016-06-01 NOTE — Care Management (Addendum)
Received consult for home health needs. PT is pending. Met with patient at bedside. She states she lives at home with her spouse. Uses a walker. Reports one recent falls. She has sever scoliosis and states she can walk to the kitchen table but is limited otherwise.She is not on O2 at home. Calls a family friend to help her get to appointments. PCP is " Dr. Jenetta Downer"  at [facility]  When ask about home health vs. SNF she states she would have to speak with her husband. Will follow progression and assist as needed.

## 2016-06-01 NOTE — H&P (Addendum)
[name] NAME: Mallory Rios    MR#:  [id number]  DATE OF BIRTH:  09-04-1927  DATE OF ADMISSION:  06/01/2016  PRIMARY CARE PHYSICIAN: No PCP Per Patient   REQUESTING/REFERRING PHYSICIAN:   CHIEF COMPLAINT:   Chief Complaint  Patient presents with  . Atrial Fibrillation    HISTORY OF PRESENT ILLNESS: Mallory Rios  is a 83 y.o. female with a known history of Atrial fibrillation, hypertension, myocardial infarction presented to the emergency room palpitations and difficulty breathing. Patient felt short of breath for the last 1 week and does not use any home oxygen. Yesterday she had a lot of palpitations and she took her metoprolol and then felt better. She also noticed increased swelling in both the lower legs for the last 1 week. Complains of orthopnea. No complaints of any chest pain. Patient was evaluated in the emergency room chest x-ray showed a fluid overload, congestion and edema. She was given IV Lasix in the emergency room for diuresis. Hospitalist service was consulted for further care of the patient. No complaints of any fever, chills and cough.  PAST MEDICAL HISTORY:   Past Medical History:  Diagnosis Date  . A-fib ([place])   . Cancer ([place])   . Hypertension   . MI (myocardial infarction)     PAST SURGICAL HISTORY: Past Surgical History:  Procedure Laterality Date  . ABDOMINAL HYSTERECTOMY    . CARDIAC SURGERY     stents x6    SOCIAL HISTORY:  Social History  Substance Use Topics  . Smoking status: Never Smoker  . Smokeless tobacco: Never Used  . Alcohol use No    FAMILY HISTORY:  Family History  Problem Relation Age of Onset  . CAD Neg Hx   . Diabetes Neg Hx   . Hypertension Neg Hx     DRUG ALLERGIES:  Allergies  Allergen Reactions  . Demerol [Meperidine]   . Other Other (See Comments)  . Propoxyphene     REVIEW OF SYSTEMS:   CONSTITUTIONAL: No fever, fatigue or weakness.  EYES:  No blurred or double vision.  EARS, NOSE, AND THROAT: No tinnitus or ear pain.  RESPIRATORY: No cough, wheezing or hemoptysis.  Has shortness of breath. CARDIOVASCULAR: No chest pain, has orthopnea, edema.  GASTROINTESTINAL: No nausea, vomiting, diarrhea or abdominal pain.  GENITOURINARY: No dysuria, hematuria.  ENDOCRINE: No polyuria, nocturia,  HEMATOLOGY: No anemia, easy bruising or bleeding SKIN: No rash or lesion. MUSCULOSKELETAL: No joint pain or arthritis.   NEUROLOGIC: No tingling, numbness, weakness.  PSYCHIATRY: No anxiety or depression.   MEDICATIONS AT HOME:  Prior to Admission medications   Medication Sig Start Date End Date Taking? Authorizing Provider  atorvastatin (LIPITOR) 40 MG tablet Take 40 mg by mouth daily at 6 PM.  02/11/16  Yes Historical Provider, MD  clopidogrel (PLAVIX) 75 MG tablet Take 75 mg by mouth daily.  02/11/16  Yes Historical Provider, MD  lisinopril (PRINIVIL,ZESTRIL) 5 MG tablet 5 mg.  02/11/16  Yes Historical Provider, MD  metoprolol tartrate (LOPRESSOR) 25 MG tablet 25 mg 2 (two) times daily.  02/11/16  Yes Historical Provider, MD  nitroGLYCERIN (NITROSTAT) 0.4 MG SL tablet Place 0.4 mg under the tongue every 5 (five) minutes as needed for chest pain.   Yes Historical Provider, MD  pantoprazole (PROTONIX) 40 MG tablet Take 40 mg by mouth daily.  02/11/16  Yes Historical Provider, MD  acetaminophen (TYLENOL) 325 MG tablet Take 650 mg by  mouth every 6 (six) hours as needed.    Historical Provider, MD  Ascorbic Acid (VITAMIN C) 500 MG CAPS Take 500 mg by mouth daily.    Historical Provider, MD  aspirin 81 MG EC tablet Take 81 mg by mouth daily. 09/09/09   Historical Provider, MD  chlorpheniramine-HYDROcodone (TUSSIONEX PENNKINETIC ER) 10-8 MG/5ML SUER Take 5 mLs by mouth at bedtime as needed for cough. Patient not taking: Reported on 03/11/2016 03/02/16   Rudene Re, MD  ferrous sulfate 325 (65 FE) MG tablet Take 325 mg by mouth daily with  breakfast.    Historical Provider, MD  furosemide (LASIX) 20 MG tablet Take 1 tablet (20 mg total) by mouth daily. Patient not taking: Reported on 06/01/2016 03/11/16 03/11/17  Nance Pear, MD  ondansetron (ZOFRAN ODT) 4 MG disintegrating tablet Take 1 tablet (4 mg total) by mouth every 8 (eight) hours as needed for nausea or vomiting. Patient not taking: Reported on 06/01/2016 03/02/16   Rudene Re, MD  vitamin E 400 UNIT capsule Take 400 Units by mouth daily.    Historical Provider, MD      PHYSICAL EXAMINATION:   VITAL SIGNS: Blood pressure 135/62, pulse 67, temperature 97.9 F (36.6 C), resp. rate 18, height 4\' 8"  (1.422 m), weight 54.7 kg (120 lb 9.5 oz), SpO2 96 %.  GENERAL:  83 y.o.-year-old patient lying in the bed in mild respiratory distress.  EYES: Pupils equal, round, reactive to light and accommodation. No scleral icterus. Extraocular muscles intact.  HEENT: Head atraumatic, normocephalic. Oropharynx and nasopharynx clear.  NECK:  Supple, no jugular venous distention. No thyroid enlargement, no tenderness.  LUNGS: decreased breath sounds bilaterally, bibasilar crepitations heard. No use of accessory muscles of respiration.  CARDIOVASCULAR: S1, S2 normal. No murmurs, rubs, or gallops.  ABDOMEN: Soft, nontender, nondistended. Bowel sounds present. No organomegaly or mass.  EXTREMITIES: Has pedal edema,  No cyanosis, or clubbing.  NEUROLOGIC: Cranial nerves II through XII are intact. Muscle strength 5/5 in all extremities. Sensation intact. Gait not checked.  PSYCHIATRIC: The patient is alert and oriented x 3.  SKIN: No obvious rash, lesion, or ulcer.   LABORATORY PANEL:   CBC  Recent Labs Lab 06/01/16 0319  WBC 9.2  HGB 12.1  HCT 36.6  PLT 250  MCV 90.4  MCH 30.0  MCHC 33.2  RDW 16.5*   ------------------------------------------------------------------------------------------------------------------  Chemistries   Recent Labs Lab 06/01/16 0319  NA  139  K 4.6  CL 106  CO2 25  GLUCOSE 105*  BUN 27*  CREATININE 0.71  CALCIUM 8.9   ------------------------------------------------------------------------------------------------------------------ estimated creatinine clearance is 34.8 mL/min (by C-G formula based on SCr of 0.71 mg/dL). ------------------------------------------------------------------------------------------------------------------ No results for input(s): TSH, T4TOTAL, T3FREE, THYROIDAB in the last 72 hours.  Invalid input(s): FREET3   Coagulation profile No results for input(s): INR, PROTIME in the last 168 hours. ------------------------------------------------------------------------------------------------------------------- No results for input(s): DDIMER in the last 72 hours. -------------------------------------------------------------------------------------------------------------------  Cardiac Enzymes  Recent Labs Lab 06/01/16 0319  TROPONINI <0.03   ------------------------------------------------------------------------------------------------------------------ Invalid input(s): POCBNP  ---------------------------------------------------------------------------------------------------------------  Urinalysis No results found for: COLORURINE, APPEARANCEUR, LABSPEC, PHURINE, GLUCOSEU, HGBUR, BILIRUBINUR, KETONESUR, PROTEINUR, UROBILINOGEN, NITRITE, LEUKOCYTESUR   RADIOLOGY: Dg Chest 2 View  Result Date: 06/01/2016 CLINICAL DATA:  Palpitations and dyspnea tonight. EXAM: CHEST  2 VIEW COMPARISON:  03/11/2016 FINDINGS: Stable cardiomegaly and hyperinflation. There are small pleural effusions bilaterally, new. Mild interstitial prominence may represent a degree of interstitial edema. No confluent airspace opacities. Hiatal hernia noted. IMPRESSION: Stable hyperinflation and cardiomegaly,  with superimposed interstitial edema and small pleural effusions. Electronically Signed   By: Andreas Newport  M.D.   On: 06/01/2016 03:53    EKG: Orders placed or performed during the hospital encounter of 06/01/16  . ED EKG within 10 minutes  . ED EKG within 10 minutes  . EKG 12-Lead  . EKG 12-Lead    IMPRESSION AND PLAN: 83 year old elderly female patient with history of atrial fibrillation, hypertension, coronary artery disease, myocardial infarction presented to the emergency room with increased shortness of breath and swelling in both lower extremities. Chest x-ray revealed vascular congestion and edema. Admitting diagnosis 1. Acute congestive heart failure 2. Dyspnea secondary to heart failure 3. Atrial fibrillation 4. Hypertension Treatment plan Admit patient to telemetry Diurese patient with IV Lasix 40 MG every 12 hourly Continue metoprolol for rate control Continue ACE inhibitor for hypertension DVT prophylaxis with subcutaneous Lovenox 40 MG daily Cycle troponin to rule out ischemia Check echocardiogram Low-salt diet Oxygen via nasal cannula Supportive care.  All the records are reviewed and case discussed with ED provider. Management plans discussed with the patient, family and they are in agreement.  CODE STATUS:FULL CODE Surrogate decision maker : Husband Code Status History    This patient does not have a recorded code status. Please follow your organizational policy for patients in this situation.       TOTAL TIME TAKING CARE OF THIS PATIENT: 51 minutes.    Saundra Shelling M.D on 06/01/2016 at 5:49 AM  Between 7am to 6pm - Pager - [phone]  After 6pm go to [email or web address] - password [email or web address] [facility]  [facility]  Office  [phone]  CC: Primary care physician; No PCP Per Patient

## 2016-06-01 NOTE — Progress Notes (Addendum)
Patient has a coaching type cough after drinking. She's says this has been a problem for about a week.  Speech evaluation ordered.  Stopped all liquids.  She's eaten all of her breakfast without difficulty except for the liquids. Will Give her meds with applesauce.

## 2016-06-01 NOTE — ED Notes (Signed)
Report to [place], Therapist, sports. Encouraged to call with questions

## 2016-06-01 NOTE — Progress Notes (Signed)
PT Cancellation Note  Patient Details Name: Mallory Rios MRN: [id number] DOB: [birth year]   Cancelled Treatment:    Reason Eval/Treat Not Completed: Medical issues which prohibited therapy. Complains she is very SOB with any mobility and would prefer to wait until tomorrow.  Will check in the AM.   Ramond Dial 06/01/2016, 1:58 PM   Mee Hives, PT MS Acute Rehab Dept. Number: [place] [facility] [place]

## 2016-06-01 NOTE — Evaluation (Signed)
Clinical/Bedside Swallow Evaluation Patient Details  Name: Mallory Rios MRN: [id number] Date of Birth: 1927-11-28  Today's Date: 06/01/2016 Time: SLP Start Time (ACUTE ONLY): 1600 SLP Stop Time (ACUTE ONLY): 1700 SLP Time Calculation (min) (ACUTE ONLY): 60 min  Past Medical History:  Past Medical History:  Diagnosis Date  . A-fib (Mallory Rios)   . Cancer (Mallory Rios)   . Hypertension   . MI (myocardial infarction)    Past Surgical History:  Past Surgical History:  Procedure Laterality Date  . ABDOMINAL HYSTERECTOMY    . CARDIAC SURGERY     stents x6   HPI:  Pt is a 82 y.o. female with a known history of Acid Reflux w/ moderate phlegm and on PPI for several years, atrial fibrillation, hypertension, myocardial infarction, scoliosis, and Hiatal Hernia presented to the emergency room palpitations and difficulty breathing. Patient felt short of breath for the last 1 week and does not use any home oxygen. Yesterday she had a lot of palpitations and she took her metoprolol and then felt better. She also noticed increased swelling in both the lower legs for the last 1 week. Complains of orthopnea. No complaints of any chest pain. Patient was evaluated in the emergency room chest x-ray showed a fluid overload, congestion and edema. She was given IV Lasix in the emergency room for diuresis. Per chart review, pt does have a notation of Hiatal Hernia per CXRs, as well as significant acid reflux on PPI, both not noted in her H&P. Pt indicated significant impact from the acid reflux at home. NSG had concern of pt's swallowing w/ thin liquids earlier today when attempting to swallowing pills. Pt has been swallowing pills w/ Applesauce appropriately since.    Assessment / Plan / Recommendation Clinical Impression  Pt appears to present w/ adequate oropharyngeal phase swallow function and at reduced risk for aspiration w/ oral intake from an oropharyngeal phase standpoint when following general aspiration  precautions and taking her time to avoid any SOB/WOB. Pt appeared to present w/ no gross oropharyngeal phase dysphagia during po trials; no overt s/s of aspiration noted w/ trials of thin liquids VIA CUP, and adequate bolus management and oral clearing noted w/ all consistencies. Pt required min extra time w/ po trials to avoid increased exertion but moreso to allow time for Esophageal clearing as pt has (baseline) significant Esophageal phase dysphagia, dysmotility, and retrograde activity as reported by pt. Pt stated the "acid reflux" has "come back up in my throat during the night", and she has difficulty coughing/clearing the Esophageal/pharyngeal phlegm she experiences in the mornings - suspect related from the Reflux during the night. Recommend a Dysphagia level 3 diet w/ thin liquids via CUP initially w/ general aspiration precautions; STRICT REFLUX PRECAUTIONS; continue PPI and consult MD re: for need to adjust for better Esophageal/Reflux management; consider GI consult; Pills swallowed w/ puree/Applesauce; rest breaks during meals and frequent, smaller meals to lessen overly full feelings. Recommend f/u w/ ENT for assessment of vocal cords secondary to Dysphonia currently.  SLP Visit Diagnosis: Dysphagia, pharyngoesophageal phase (R13.14) (Esophageal phase dysmotility)    Aspiration Risk  Mild aspiration risk (from Esophageal dysmotility, Reflux)    Diet Recommendation  Dysphagia level 3(cut/chopped meats); thin liquids. Added purees and soups in diet for easier Esophageal phase motility/toleration. General aspiration precautions; STRICT REFLUX PRECAUTIONS.  Medication Administration: Whole meds with puree    Other  Recommendations Recommended Consults: Consider GI evaluation;Consider esophageal assessment (for better management; Dietician consult) Oral Care Recommendations: Oral care BID;Staff/trained  caregiver to provide oral care   Follow up Recommendations None      Frequency and  Duration min 2x/week  1 week       Prognosis Prognosis for Safe Diet Advancement: Good Barriers to Reach Goals:  (deconditioned; Reflux baseline)      Swallow Study   General Date of Onset: 06/01/16 HPI: Pt is a 83 y.o. female with a known history of Acid Reflux w/ moderate phlegm and on PPI for several years, atrial fibrillation, hypertension, myocardial infarction, scoliosis, and Hiatal Hernia presented to the emergency room palpitations and difficulty breathing. Patient felt short of breath for the last 1 week and does not use any home oxygen. Yesterday she had a lot of palpitations and she took her metoprolol and then felt better. She also noticed increased swelling in both the lower legs for the last 1 week. Complains of orthopnea. No complaints of any chest pain. Patient was evaluated in the emergency room chest x-ray showed a fluid overload, congestion and edema. She was given IV Lasix in the emergency room for diuresis. Per chart review, pt does have a notation of Hiatal Hernia per CXRs, as well as significant acid reflux on PPI, both not noted in her H&P. Pt indicated significant impact from the acid reflux at home. NSG had concern of pt's swallowing w/ thin liquids earlier today when attempting to swallowing pills. Pt has been swallowing pills w/ Applesauce appropriately since.  Type of Study: Bedside Swallow Evaluation Previous Swallow Assessment: none reported Diet Prior to this Study: Regular;Thin liquids ("soft foods") Temperature Spikes Noted: No (wbc 9.2) Respiratory Status: Nasal cannula (2 liters) History of Recent Intubation: No Behavior/Cognition: Alert;Cooperative;Pleasant mood Oral Cavity Assessment: Within Functional Limits Oral Care Completed by SLP: Recent completion by staff Oral Cavity - Dentition: Adequate natural dentition;Missing dentition Vision: Functional for self-feeding Self-Feeding Abilities: Able to feed self;Needs set up Patient Positioning: Upright in  bed (though has scoliosis which impacts her positioning some) Baseline Vocal Quality: Breathy;Hoarse (Dysphonia - concern for aspiration of Reflux) Volitional Cough: Congested (min; adequate) Volitional Swallow: Able to elicit    Oral/Motor/Sensory Function Overall Oral Motor/Sensory Function: Within functional limits   Ice Chips Ice chips: Not tested   Thin Liquid Thin Liquid: Within functional limits Presentation: Cup;Self Fed (8 trials) Other Comments: took single sips slowly to avoid any increased WOB/SOB from impact of exertion    Nectar Thick Nectar Thick Liquid: Not tested   Honey Thick Honey Thick Liquid: Not tested   Puree Puree: Within functional limits Presentation: Self Fed;Spoon (3 trials)   Solid   GO   Solid: Not tested Other Comments: pt had finished lunch meal and declined         Orinda Kenner, MS, CCC-SLP Gina Costilla 06/01/2016,5:55 PM

## 2016-06-01 NOTE — ED Notes (Addendum)
Pt assist on bedpanx2 for urination. No complications. Pt rolls without assistance.

## 2016-06-01 NOTE — ED Triage Notes (Signed)
Per acems; pt called out d/t palpitations, did not take prescribed metoprolol today. palpitations began, took meds, converted to normal sinus in route. VSS. 115 cbg. Hx chf, PNA in December. Recent cough with clear-pink tinge.  Presenting with blat swollen feet. Crackles present

## 2016-06-02 ENCOUNTER — Inpatient Hospital Stay
Admit: 2016-06-02 | Discharge: 2016-06-02 | Disposition: A | Discharge status: Home / Self Care | Payer: Medicare Other | Attending: Internal Medicine | Admitting: Internal Medicine

## 2016-06-02 LAB — BASIC METABOLIC PANEL
ANION GAP: 9 (ref 5–15)
BUN: 26 mg/dL — ABNORMAL HIGH (ref 6–20)
CHLORIDE: 100 mmol/L — AB (ref 101–111)
CO2: 32 mmol/L (ref 22–32)
Calcium: 9.3 mg/dL (ref 8.9–10.3)
Creatinine, Ser: 0.48 mg/dL (ref 0.44–1.00)
GFR calc Af Amer: >60 mL/min (ref >60)
GLUCOSE: 104 mg/dL — AB (ref 65–99)
POTASSIUM: 4 mmol/L (ref 3.5–5.1)
SODIUM: 141 mmol/L (ref 135–145)

## 2016-06-02 LAB — ECHOCARDIOGRAM COMPLETE
Height: 56 in
WEIGHTICAEL: 1860.8 [oz_av]

## 2016-06-02 MED ORDER — SODIUM CHLORIDE 0.9 % IV BOLUS (SEPSIS)
250.0000 mL | Freq: Once | INTRAVENOUS | Status: AC
  Administered 2016-06-02: 250 mL via INTRAVENOUS

## 2016-06-02 MED ORDER — FUROSEMIDE 10 MG/ML IJ SOLN: 20.0000 mg | Freq: Two times a day (BID) | INTRAMUSCULAR | Status: DC

## 2016-06-02 NOTE — Progress Notes (Signed)
*  PRELIMINARY RESULTS* Echocardiogram 2D Echocardiogram has been performed.  Mallory Rios 06/02/2016, 8:22 AM

## 2016-06-02 NOTE — Progress Notes (Signed)
NOtified Dr. Darvin Neighbours of bp 80/43. A bolus of ns ordered.

## 2016-06-02 NOTE — Progress Notes (Signed)
DR Manuella Ghazi was informed about pt bp in the 70's , order for 250 ml ns bolus to be given , will continue to monitor

## 2016-06-02 NOTE — Progress Notes (Signed)
[facility] [place] NAME: Mallory Rios    MR#:  [id number]  DATE OF BIRTH:  December 23, 1927  SUBJECTIVE:  Feels tired and short of breath.  Blood pressure has been running low making it difficult to give her any of her heart medication REVIEW OF SYSTEMS:   Review of Systems  Constitutional: Positive for malaise/fatigue. Negative for chills, fever and weight loss.  HENT: Negative for nosebleeds and sore throat.   Eyes: Negative for blurred vision.  Respiratory: Positive for shortness of breath. Negative for cough and wheezing.   Cardiovascular: Positive for palpitations. Negative for chest pain, orthopnea, leg swelling and PND.  Gastrointestinal: Negative for abdominal pain, constipation, diarrhea, heartburn, nausea and vomiting.  Genitourinary: Negative for dysuria and urgency.  Musculoskeletal: Negative for back pain.  Skin: Negative for rash.  Neurological: Positive for weakness. Negative for dizziness, speech change, focal weakness and headaches.  Endo/Heme/Allergies: Does not bruise/bleed easily.  Psychiatric/Behavioral: Negative for depression.   Tolerating Diet: Tolerating PT:   DRUG ALLERGIES:   Allergies  Allergen Reactions  . Demerol [Meperidine]   . Other Other (See Comments)  . Propoxyphene     VITALS:  Blood pressure (!) 95/58, pulse 76, temperature 97.6 F (36.4 C), temperature source Oral, resp. rate 18, height 4\' 8"  (1.422 m), weight 52.8 kg (116 lb 4.8 oz), SpO2 98 %. PHYSICAL EXAMINATION:   Physical Exam  Constitutional: She is oriented to person, place, and time and well-developed, well-nourished, and in no distress.  HENT:  Head: Normocephalic and atraumatic.  Eyes: Conjunctivae and EOM are normal. Pupils are equal, round, and reactive to light.  Neck: Normal range of motion. Neck supple. No tracheal deviation present. No thyromegaly present.  Cardiovascular: Normal rate, regular rhythm and normal  heart sounds.   Pulmonary/Chest: Effort normal and breath sounds normal. No respiratory distress. She has no wheezes. She exhibits no tenderness.  Abdominal: Soft. Bowel sounds are normal. She exhibits no distension. There is no tenderness.  Musculoskeletal: Normal range of motion.  Neurological: She is alert and oriented to person, place, and time. No cranial nerve deficit.  Skin: Skin is warm and dry. No rash noted.  Psychiatric: Mood and affect normal.   LABORATORY PANEL:  CBC  Recent Labs Lab 06/01/16 0319  WBC 9.2  HGB 12.1  HCT 36.6  PLT 250    Chemistries   Recent Labs Lab 06/02/16 0548  NA 141  K 4.0  CL 100*  CO2 32  GLUCOSE 104*  BUN 26*  CREATININE 0.48  CALCIUM 9.3   Cardiac Enzymes  Recent Labs Lab 06/01/16 2111  TROPONINI <0.03   RADIOLOGY:  Dg Chest 2 View  Result Date: 06/01/2016 CLINICAL DATA:  Palpitations and dyspnea tonight. EXAM: CHEST  2 VIEW COMPARISON:  03/11/2016 FINDINGS: Stable cardiomegaly and hyperinflation. There are small pleural effusions bilaterally, new. Mild interstitial prominence may represent a degree of interstitial edema. No confluent airspace opacities. Hiatal hernia noted. IMPRESSION: Stable hyperinflation and cardiomegaly, with superimposed interstitial edema and small pleural effusions. Electronically Signed   By: Andreas Newport M.D.   On: 06/01/2016 03:53   ASSESSMENT AND PLAN:  83 year old elderly female patient with history of atrial fibrillation, hypertension, coronary artery disease, myocardial infarction presented to the emergency room with increased shortness of breath and swelling in both lower extremities. Chest x-ray revealed vascular congestion and edema.  1. Acute diastolic congestive heart failure -ECHO shows EF of 50-55% -Strict I and O, daily weights -  Cardiology consultation   *Hypotension with a history of hypertension  We will hold metoprolol lisinopril and Lasix as well  - give 250 cc of bolus one  time considering blood pressure in 70s -Monitor blood pressure  * Atrial fibrillation, chornic appears to be stable -Cardio consultation  * H/o CAD  Cont plavix and statins  * GERD  -PPI bid and zantac at bedtime  Case discussed with Care Management/Social Worker. Management plans discussed with the patient, family and they are in agreement.  CODE STATUS: Full  DVT Prophylaxis: lovenox TOTAL TIME TAKING CARE OF THIS PATIENT:30 minutes.  >50% time spent on counselling and coordination of care  POSSIBLE D/C IN 1-2DAYS, DEPENDING ON CLINICAL CONDITION.  Note: This dictation was prepared with Dragon dictation along with smaller phrase technology. Any transcriptional errors that result from this process are unintentional.  Max Sane M.D on 06/02/2016 at 5:09 PM  Between 7am to 6pm - Pager - [phone]  After 6pm go to [email or web address] - password [email or web address] [facility]  Office  [phone]  CC: Primary care physician; No PCP Per Patient

## 2016-06-02 NOTE — Progress Notes (Signed)
DR Manuella Ghazi was made aware of pt's current bp , being low , stated he will make adjustment to pt meds

## 2016-06-02 NOTE — NC FL2 (Signed)
[place] LEVEL OF CARE SCREENING TOOL     IDENTIFICATION  Patient Name: Mallory Rios Birthdate: 1927-11-15 Sex: female Admission Date (Current Location): 06/01/2016  [place] and [place] Number:  Engineering geologist and Address:  [facility], [address] [place], [place] [address]      Provider Number: [id number]  Attending Physician Name and Address:  Max Sane, MD  Relative Name and Phone Number:  Jakeisha, Stricker Spouse   [phone] or Marisue [place]   [phone]     Current Level of Care: Hospital Recommended Level of Care: [place] Prior Approval Number:    Date Approved/Denied:   PASRR Number: [id number]  Discharge Plan: SNF    Current Diagnoses: Patient Active Problem List   Diagnosis Date Noted  . CHF (congestive heart failure) ([place]) 06/01/2016    Orientation RESPIRATION BLADDER Height & Weight     Self, Time, Situation, Place  O2 (2L) Continent Weight: 116 lb 4.8 oz (52.8 kg) Height:  4\' 8"  (142.2 cm) (sever scoliosis)  BEHAVIORAL SYMPTOMS/MOOD NEUROLOGICAL BOWEL NUTRITION STATUS      Continent Diet (Dysphagia 3)  AMBULATORY STATUS COMMUNICATION OF NEEDS Skin   Extensive Assist Verbally Normal                       Personal Care Assistance Level of Assistance  Bathing, Dressing, Feeding Bathing Assistance: Limited assistance Feeding assistance: Independent Dressing Assistance: Limited assistance     Functional Limitations Info  Sight, Hearing, Speech Sight Info: Adequate Hearing Info: Adequate Speech Info: Adequate    SPECIAL CARE FACTORS FREQUENCY  PT (By licensed PT)     PT Frequency: Minimum 5x a week              Contractures Contractures Info: Not present    Additional Factors Info  Code Status, Allergies Code Status Info: Full Code Allergies Info: DEMEROL MEPERIDINE, OTHER, PROPOXYPHENE            Current Medications (06/02/2016):  This is the  current hospital active medication list Current Facility-Administered Medications  Medication Dose Route Frequency Provider Last Rate Last Dose  . 0.9 %  sodium chloride infusion  250 mL Intravenous PRN Saundra Shelling, MD      . acetaminophen (TYLENOL) tablet 1,000 mg  1,000 mg Oral BID Fritzi Mandes, MD   1,000 mg at 06/02/16 0841  . acetaminophen (TYLENOL) tablet 650 mg  650 mg Oral Q4H PRN Saundra Shelling, MD   650 mg at 06/01/16 1453  . aspirin EC tablet 81 mg  81 mg Oral Daily Saundra Shelling, MD   81 mg at 06/02/16 3903  . atorvastatin (LIPITOR) tablet 40 mg  40 mg Oral q1800 Saundra Shelling, MD   40 mg at 06/01/16 1746  . clopidogrel (PLAVIX) tablet 75 mg  75 mg Oral Daily Saundra Shelling, MD   75 mg at 06/02/16 0092  . enoxaparin (LOVENOX) injection 40 mg  40 mg Subcutaneous Q24H Saundra Shelling, MD   40 mg at 06/01/16 2104  . famotidine (PEPCID) tablet 20 mg  20 mg Oral QHS Fritzi Mandes, MD   20 mg at 06/01/16 2106  . ferrous sulfate tablet 325 mg  325 mg Oral Q breakfast Pavan Pyreddy, MD      . furosemide (LASIX) injection 40 mg  40 mg Intravenous Q12H Saundra Shelling, MD   40 mg at 06/02/16 0501  . lisinopril (PRINIVIL,ZESTRIL) tablet 5 mg  5 mg Oral Daily  Saundra Shelling, MD   5 mg at 06/02/16 0922  . nitroGLYCERIN (NITROSTAT) SL tablet 0.4 mg  0.4 mg Sublingual Q5 min PRN Saundra Shelling, MD      . ondansetron (ZOFRAN) injection 4 mg  4 mg Intravenous Q6H PRN Pavan Pyreddy, MD      . pantoprazole (PROTONIX) EC tablet 40 mg  40 mg Oral BID Fritzi Mandes, MD   40 mg at 06/02/16 0841  . sodium chloride flush (NS) 0.9 % injection 3 mL  3 mL Intravenous Q12H Saundra Shelling, MD   3 mL at 06/02/16 0923  . sodium chloride flush (NS) 0.9 % injection 3 mL  3 mL Intravenous PRN Pavan Pyreddy, MD      . vitamin C (ASCORBIC ACID) tablet 500 mg  500 mg Oral Daily Saundra Shelling, MD   500 mg at 06/02/16 1031  . vitamin E capsule 400 Units  400 Units Oral Daily Saundra Shelling, MD   400 Units at 06/02/16 5945     Discharge  Medications: Please see discharge summary for a list of discharge medications.  Relevant Imaging Results:  Relevant Lab Results:   Additional Information SSN [id number]  Ross Ludwig, [place]

## 2016-06-02 NOTE — Discharge Instructions (Signed)
Heart Failure Clinic appointment on June 10, 2016 at 1:30pm with Darylene Price, [place]. Please call [phone] to reschedule.

## 2016-06-02 NOTE — Progress Notes (Signed)
PT Cancellation Note  Patient Details Name: Mallory Rios MRN: [id number] DOB: 12/10/1927   Cancelled Treatment:    Reason Eval/Treat Not Completed: Medical issues which prohibited therapy.  Per discussion with nursing, pt's recent BP in 70's.  Pt does not appear medically appropriate for PT at this time (d/t low BP).  Will re-attempt PT eval at a later date/time as medically appropriate.  Leitha Bleak, PT 06/02/16, 3:51 PM [phone]

## 2016-06-03 LAB — BASIC METABOLIC PANEL
Anion gap: 8 (ref 5–15)
BUN: 38 mg/dL — AB (ref 6–20)
CHLORIDE: 102 mmol/L (ref 101–111)
CO2: 26 mmol/L (ref 22–32)
Calcium: 9.1 mg/dL (ref 8.9–10.3)
Creatinine, Ser: 0.65 mg/dL (ref 0.44–1.00)
GFR calc Af Amer: >60 mL/min (ref >60)
GFR calc non Af Amer: >60 mL/min (ref >60)
Glucose, Bld: 109 mg/dL — ABNORMAL HIGH (ref 65–99)
POTASSIUM: 4.1 mmol/L (ref 3.5–5.1)
SODIUM: 136 mmol/L (ref 135–145)

## 2016-06-03 LAB — CBC
HEMATOCRIT: 36.2 % (ref 35.0–47.0)
Hemoglobin: 12.1 g/dL (ref 12.0–16.0)
MCH: 30.2 pg (ref 26.0–34.0)
MCHC: 33.3 g/dL (ref 32.0–36.0)
MCV: 90.6 fL (ref 80.0–100.0)
Platelets: 254 10*3/uL (ref 150–440)
RBC: 4 MIL/uL (ref 3.80–5.20)
RDW: 16.3 % — ABNORMAL HIGH (ref 11.5–14.5)
WBC: 9.9 10*3/uL (ref 3.6–11.0)

## 2016-06-03 MED ORDER — FUROSEMIDE 10 MG/ML IJ SOLN
20.0000 mg | Freq: Two times a day (BID) | INTRAMUSCULAR | Status: DC
  Administered 2016-06-03 – 2016-06-05 (×4): 20 mg via INTRAVENOUS
  Filled 2016-06-03 (×4): qty 2

## 2016-06-03 NOTE — Progress Notes (Signed)
Pt oob to commode with one assisst and returned to bed after this. Urine is clear yellow. Call bell in reach.

## 2016-06-03 NOTE — Consult Note (Signed)
[facility] Consultation Note  Patient ID: Mallory Rios, MRN: [id number], DOB/AGE: [birth year] 81 y.o. Admit date: 06/01/2016   Date of Consult: 06/03/2016 Primary Physician: No PCP Per Patient [facility] [place]  Chief Complaint:  Chief Complaint  Patient presents with  . Atrial Fibrillation   Reason for Consult: heart failure  HPI: 83 y.o. female with known previous myocardial infarction coronary artery disease status post multiple stenting in the past essential hypertension makes hyperlipidemia and paroxysmal nonvalvular atrial fibrillation with acute onset of lower extremity edema pulmonary edema shortness of breath weakness fatigue and weight gain. The patient has then had this over the last week or so significant concerning for the possibility of acute diastolic dysfunction heart failure. The patient did have an echocardiogram last year showing normal LV systolic function with severe tricuspid regurgitation and moderate mitral regurgitation and mild aortic insufficiency. With this she has also had atrial fibrillation for which she had rapid ventricular rate upon arrival to the emergency room. It is possible that the patient's atrial fibrillation had caused tachycardic-induced heart failure. Currently she has had significant improvements with appropriate intravenous Lasix. She has also had a high intensity cholesterol therapy for previous coronary artery disease and currently has not had any chest pain or elevated troponin consistent with myocardial infarction or acute coronary syndrome  Past Medical History:  Diagnosis Date  . A-fib ([place])   . Cancer ([place])   . Hypertension   . MI (myocardial infarction)       Surgical History:  Past Surgical History:  Procedure Laterality Date  . ABDOMINAL HYSTERECTOMY    . CARDIAC SURGERY     stents x6     Home Meds: Prior to Admission medications   Medication Sig Start Date End Date Taking? Authorizing Provider   atorvastatin (LIPITOR) 40 MG tablet Take 40 mg by mouth daily at 6 PM.  02/11/16  Yes Historical Provider, MD  clopidogrel (PLAVIX) 75 MG tablet Take 75 mg by mouth daily.  02/11/16  Yes Historical Provider, MD  lisinopril (PRINIVIL,ZESTRIL) 5 MG tablet 5 mg.  02/11/16  Yes Historical Provider, MD  metoprolol tartrate (LOPRESSOR) 25 MG tablet 25 mg 2 (two) times daily.  02/11/16  Yes Historical Provider, MD  nitroGLYCERIN (NITROSTAT) 0.4 MG SL tablet Place 0.4 mg under the tongue every 5 (five) minutes as needed for chest pain.   Yes Historical Provider, MD  pantoprazole (PROTONIX) 40 MG tablet Take 40 mg by mouth daily.  02/11/16  Yes Historical Provider, MD  acetaminophen (TYLENOL) 325 MG tablet Take 650 mg by mouth every 6 (six) hours as needed.    Historical Provider, MD  Ascorbic Acid (VITAMIN C) 500 MG CAPS Take 500 mg by mouth daily.    Historical Provider, MD  aspirin 81 MG EC tablet Take 81 mg by mouth daily. 09/09/09   Historical Provider, MD  chlorpheniramine-HYDROcodone (TUSSIONEX PENNKINETIC ER) 10-8 MG/5ML SUER Take 5 mLs by mouth at bedtime as needed for cough. Patient not taking: Reported on 03/11/2016 03/02/16   Rudene Re, MD  ferrous sulfate 325 (65 FE) MG tablet Take 325 mg by mouth daily with breakfast.    Historical Provider, MD  furosemide (LASIX) 20 MG tablet Take 1 tablet (20 mg total) by mouth daily. Patient not taking: Reported on 06/01/2016 03/11/16 03/11/17  Nance Pear, MD  ondansetron (ZOFRAN ODT) 4 MG disintegrating tablet Take 1 tablet (4 mg total) by mouth every 8 (eight) hours as needed for nausea or vomiting. Patient not taking:  Reported on 06/01/2016 03/02/16   Rudene Re, MD  vitamin E 400 UNIT capsule Take 400 Units by mouth daily.    Historical Provider, MD    Inpatient Medications:  . acetaminophen  1,000 mg Oral BID  . aspirin EC  81 mg Oral Daily  . atorvastatin  40 mg Oral q1800  . clopidogrel  75 mg Oral Daily  . enoxaparin (LOVENOX)  injection  40 mg Subcutaneous Q24H  . famotidine  20 mg Oral QHS  . ferrous sulfate  325 mg Oral Q breakfast  . furosemide  20 mg Intravenous BID  . pantoprazole  40 mg Oral BID  . sodium chloride flush  3 mL Intravenous Q12H  . vitamin C  500 mg Oral Daily  . vitamin E  400 Units Oral Daily     Allergies:  Allergies  Allergen Reactions  . Demerol [Meperidine]   . Other Other (See Comments)  . Propoxyphene     Social History   Social History  . Marital status: Married    Spouse name: N/A  . Number of children: N/A  . Years of education: N/A   Occupational History  . retired    Social History Main Topics  . Smoking status: Never Smoker  . Smokeless tobacco: Never Used  . Alcohol use No  . Drug use: No  . Sexual activity: Not on file   Other Topics Concern  . Not on file   Social History Narrative  . No narrative on file     Family History  Problem Relation Age of Onset  . CAD Neg Hx   . Diabetes Neg Hx   . Hypertension Neg Hx      Review of Systems Positive forShortness of breath PND orthopnea Negative for: General:  chills, fever, night sweats or sit up for weight changes.  Cardiovascular: Nausea for PND orthopnea negative for syncope dizziness  Dermatological skin lesions rashes Respiratory: Cough congestion Urologic: Frequent urination urination at night and hematuria Abdominal: negative for nausea, vomiting, diarrhea, bright red blood per rectum, melena, or hematemesis Neurologic: negative for visual changes, and/or hearing changes  All other systems reviewed and are otherwise negative except as noted above.  Labs:  Recent Labs  06/01/16 0319 06/01/16 1013 06/01/16 1336 06/01/16 2111  TROPONINI <0.03 <0.03 <0.03 <0.03   Lab Results  Component Value Date   WBC 9.9 06/03/2016   HGB 12.1 06/03/2016   HCT 36.2 06/03/2016   MCV 90.6 06/03/2016   PLT 254 06/03/2016    Recent Labs Lab 06/03/16 0504  NA 136  K 4.1  CL 102  CO2 26  BUN  38*  CREATININE 0.65  CALCIUM 9.1  GLUCOSE 109*   No results found for: CHOL, HDL, LDLCALC, TRIG No results found for: DDIMER  Radiology/Studies:  Dg Chest 2 View  Result Date: 06/01/2016 CLINICAL DATA:  Palpitations and dyspnea tonight. EXAM: CHEST  2 VIEW COMPARISON:  03/11/2016 FINDINGS: Stable cardiomegaly and hyperinflation. There are small pleural effusions bilaterally, new. Mild interstitial prominence may represent a degree of interstitial edema. No confluent airspace opacities. Hiatal hernia noted. IMPRESSION: Stable hyperinflation and cardiomegaly, with superimposed interstitial edema and small pleural effusions. Electronically Signed   By: Andreas Newport M.D.   On: 06/01/2016 03:53    FUX:NATFTD fibrillation with right bundle branch block and lateral myocardial infarction  Weights: Filed Weights   06/01/16 0737 06/02/16 0455 06/03/16 0418  Weight: 53 kg (116 lb 14.4 oz) 52.8 kg (116 lb 4.8 oz)  50.9 kg (112 lb 3.2 oz)     Physical Exam: Blood pressure (!) 137/55, pulse 87, temperature 97.8 F (36.6 C), resp. rate 18, height 4\' 8"  (1.422 m), weight 50.9 kg (112 lb 3.2 oz), SpO2 99 %. Body mass index is 25.15 kg/m. General: Well developed, well nourished, in no acute distress. Head eyes ears nose throat: Normocephalic, atraumatic, sclera non-icteric, no xanthomas, nares are without discharge. No apparent thyromegaly and/or mass  Lungs: Normal respiratory effort.  no wheezes,Basilar rales, no rhonchi.  Heart: Irregular with normal S1 S2. no murmur gallop, no rub, PMI is normal size and placement, carotid upstroke normal without bruit, jugular venous pressure is normal Abdomen: Soft, non-tender, non-distended with normoactive bowel sounds. No hepatomegaly. No rebound/guarding. No obvious abdominal masses. Abdominal aorta is normal size without bruit Extremities:1+ edema. no cyanosis, no clubbing, no ulcers  Peripheral : 2+ bilateral upper extremity pulses, 2+ bilateral  femoral pulses, 2+ bilateral dorsal pedal pulse Neuro: Alert and oriented. No facial asymmetry. No focal deficit. Moves all extremities spontaneously. Musculoskeletal: Normal muscle tone with kyphosis Psych:  Responds to questions appropriately with a normal affect.    Assessment: 83 year old female with acute diastolic dysfunction congestive heart failure possibly due to atrial fibrillation with rapid ventricular rate essential hypertension and old myocardial infarction without evidence of new myocardial infarction and/or acute coronary syndrome  Plan: 1. Continue furosemide intravenously for lower extremity edema pulmonary edema and acute diastolic dysfunction heart failure 2. Heart rate control of atrial fibrillation if necessary with beta blocker with a goal heart rate below 80 bpm 3. Further evaluation of need for possible anticoagulation if able due to patient's risk of stroke with atrial fibrillation 4. No further cardiac diagnostics at this time due to no evidence of acute coronary syndrome or myocardial infarction  Signed, Corey Skains M.D. [facility] 06/03/2016, 5:51 PM

## 2016-06-03 NOTE — Progress Notes (Signed)
This writer assumed care of pt at 1530. Pt resting with eyes closed on rounds at that time. Call bell in reach.

## 2016-06-03 NOTE — Progress Notes (Signed)
[facility] [place] NAME: Mallory Rios    MR#:  [id number]  DATE OF BIRTH:  Dec 10, 1927  SUBJECTIVE:  Seems SOB, wants lasix - required fluid bolus y'day as BP in 70-80s REVIEW OF SYSTEMS:   Review of Systems  Constitutional: Positive for malaise/fatigue. Negative for chills, fever and weight loss.  HENT: Negative for nosebleeds and sore throat.   Eyes: Negative for blurred vision.  Respiratory: Positive for shortness of breath. Negative for cough and wheezing.   Cardiovascular: Positive for palpitations. Negative for chest pain, orthopnea, leg swelling and PND.  Gastrointestinal: Negative for abdominal pain, constipation, diarrhea, heartburn, nausea and vomiting.  Genitourinary: Negative for dysuria and urgency.  Musculoskeletal: Negative for back pain.  Skin: Negative for rash.  Neurological: Positive for weakness. Negative for dizziness, speech change, focal weakness and headaches.  Endo/Heme/Allergies: Does not bruise/bleed easily.  Psychiatric/Behavioral: Negative for depression.   Tolerating Diet:yes Tolerating PT:   DRUG ALLERGIES:   Allergies  Allergen Reactions  . Demerol [Meperidine]   . Other Other (See Comments)  . Propoxyphene     VITALS:  Blood pressure (!) 137/55, pulse 87, temperature 97.8 F (36.6 C), resp. rate 18, height 4\' 8"  (1.422 m), weight 50.9 kg (112 lb 3.2 oz), SpO2 99 %. PHYSICAL EXAMINATION:   Physical Exam  Constitutional: She is oriented to person, place, and time and well-developed, well-nourished, and in no distress.  HENT:  Head: Normocephalic and atraumatic.  Eyes: Conjunctivae and EOM are normal. Pupils are equal, round, and reactive to light.  Neck: Normal range of motion. Neck supple. No tracheal deviation present. No thyromegaly present.  Cardiovascular: Normal rate, regular rhythm and normal heart sounds.   Pulmonary/Chest: Accessory muscle usage present. She is in respiratory  distress. She has decreased breath sounds in the right lower field and the left lower field. She has no wheezes. She has rales. She exhibits no tenderness.  Abdominal: Soft. Bowel sounds are normal. She exhibits no distension. There is no tenderness.  Musculoskeletal: Normal range of motion.  Neurological: She is alert and oriented to person, place, and time. No cranial nerve deficit.  Skin: Skin is warm and dry. No rash noted.  Psychiatric: Mood and affect normal.   LABORATORY PANEL:  CBC  Recent Labs Lab 06/03/16 0504  WBC 9.9  HGB 12.1  HCT 36.2  PLT 254    Chemistries   Recent Labs Lab 06/03/16 0504  NA 136  K 4.1  CL 102  CO2 26  GLUCOSE 109*  BUN 38*  CREATININE 0.65  CALCIUM 9.1   Cardiac Enzymes  Recent Labs Lab 06/01/16 2111  TROPONINI <0.03   RADIOLOGY:  No results found. ASSESSMENT AND PLAN:  83 year old elderly female patient with history of atrial fibrillation, hypertension, coronary artery disease, myocardial infarction presented to the emergency room with increased shortness of breath and swelling in both lower extremities. Chest x-ray revealed vascular congestion and edema.  1. Acute diastolic congestive heart failure -ECHO shows EF of 50-55% -Strict I and O, daily weights. Neg 2.9 liters -Cardiology consultation Pending  *Hypotension with a history of hypertension  - hold metoprolol and lisinopril. Give Lasix this evening -Monitor blood pressure  * Atrial fibrillation, chornic appears to be stable -Cardio c/s Pending  * H/o CAD  Cont plavix and statins  * GERD  -PPI bid and zantac at bedtime  Case discussed with Care Management/Social Worker. Management plans discussed with the patient, nursing and they are  in agreement.  CODE STATUS: Full  DVT Prophylaxis: lovenox  TOTAL TIME TAKING CARE OF THIS PATIENT:30 minutes.   >50% time spent on counselling and coordination of care  POSSIBLE D/C IN 1-2 DAYS, DEPENDING ON CLINICAL  CONDITION.  Note: This dictation was prepared with Dragon dictation along with smaller phrase technology. Any transcriptional errors that result from this process are unintentional.  Max Sane M.D on 06/03/2016 at 3:47 PM  Between 7am to 6pm - Pager - [phone]  After 6pm go to [email or web address] - password [email or web address] [facility]  Office  [phone]  CC: Primary care physician; No PCP Per Patient

## 2016-06-03 NOTE — Evaluation (Addendum)
Physical Therapy Evaluation Patient Details Name: Mallory Rios MRN: [id number] DOB: 07/12/1927 Today's Date: 06/03/2016   History of Present Illness  presented to ER secondary to SOB, LE edema and heart palpitations x1 week; admitted with acute CHF exacerbation.  Clinical Impression  Upon evaluation, patient alert and oriented to self; intermittently follows simple commands.  Easily agitated with inconsistent participation, active effort during session.  Bilat UE/LE globally weak and deconditioned; significant thoracic scoliosis with curvature towards L.  Patient able to complete bed mobility with min assist.  Patient with three partial attempts to stand from edge of bed-refused to allow therapist to place gait belt or assist with movement transition.  Patient able to shift weight forward but does not unweight buttocks; unable to maintain foot placement on floor.  Refuses further attempts and spontaneously returns to bed; refuses futher participation. Would benefit from skilled PT to address above deficits and promote optimal return to PLOF; recommend transition to STR upon discharge from acute hospitalization, pending increased participation/effort with skilled intervention.    Follow Up Recommendations SNF    Equipment Recommendations       Recommendations for Other Services       Precautions / Restrictions Precautions Precautions: Fall Restrictions Weight Bearing Restrictions: No      Mobility  Bed Mobility Overal bed mobility: Needs Assistance Bed Mobility: Supine to Sit;Sit to Supine     Supine to sit: Min assist Sit to supine: Min assist      Transfers Overall transfer level: Needs assistance Equipment used: Rolling walker (2 wheeled) Transfers: Sit to/from Stand           General transfer comment: patient with three partial attempts to stand from edge of bed-refused to allow therapist to place gait belt or assist with movement transition.  Patient able to  shift weight forward but does not unweight buttocks; unable to maintain foot placement on floor.  Refuses further attempts and spontaneously returns to bed; refuses futher participation.  Ambulation/Gait             General Gait Details: refused/unable  Stairs            Wheelchair Mobility    Modified Rankin (Stroke Patients Only)       Balance Overall balance assessment: Needs assistance Sitting-balance support: No upper extremity supported;Feet supported Sitting balance-Leahy Scale: Fair                                       Pertinent Vitals/Pain Faces Pain Scale: Hurts little more Pain Location: bilat LEs Pain Descriptors / Indicators: Grimacing Pain Intervention(s): Limited activity within patient's tolerance;Monitored during session;Repositioned    Home Living Family/patient expects to be discharged to:: Private residence Living Arrangements: Spouse/significant other Available Help at Discharge: Family Type of Home: House Home Access: Stairs to enter Entrance Stairs-Rails: None Technical brewer of Steps: 2 Home Layout: One level Home Equipment: Environmental consultant - 2 wheels      Prior Function Level of Independence: Independent with assistive device(s)         Comments: Per patient, mod indep with walker for ADLs and limited household ambulation.  Endorses single fall in previous six months.  PAtient questionable historian; will verify with family as available.     Hand Dominance        Extremity/Trunk Assessment   Upper Extremity Assessment Upper Extremity Assessment: Generalized weakness (patient refused formal MMT/strength assessment)  Lower Extremity Assessment Lower Extremity Assessment: Generalized weakness (grossly at least 3-/5, suspect some degree of bilat ankle PF contracture.  Patient with limited paticipation with strength/ROM assessment.  Yells in pain and withdraws from therapist during ankle ROM assessment; easily  agitated.)    Cervical / Trunk Assessment Cervical / Trunk Assessment:  (significant scoliosis with curvature towards L)  Communication   Communication: No difficulties  Cognition Arousal/Alertness: Awake/alert Behavior During Therapy: Agitated;Impulsive Overall Cognitive Status: Difficult to assess                      General Comments      Exercises     Assessment/Plan    PT Assessment Patient needs continued PT services  PT Problem List Decreased range of motion;Decreased strength;Decreased activity tolerance;Decreased balance;Decreased mobility;Decreased coordination;Decreased cognition;Decreased knowledge of use of DME;Decreased safety awareness;Cardiopulmonary status limiting activity       PT Treatment Interventions DME instruction;Gait training;Functional mobility training;Therapeutic activities;Therapeutic exercise;Balance training;Patient/family education    PT Goals (Current goals can be found in the Care Plan section)  Acute Rehab PT Goals Patient Stated Goal: "I'm NOT thinking about a rehab place" PT Goal Formulation: With patient Time For Goal Achievement: 06/17/16 Potential to Achieve Goals: Fair    Frequency Min 2X/week   Barriers to discharge        Co-evaluation               End of Session   Activity Tolerance: Treatment limited secondary to agitation Patient left: in bed;with bed alarm set;with call bell/phone within reach   PT Visit Diagnosis: Muscle weakness (generalized) (M62.81);Unsteadiness on feet (R26.81)         Time: 6387-5643 PT Time Calculation (min) (ACUTE ONLY): 13 min   Charges:   PT Evaluation $PT Eval Low Complexity: 1 Procedure     PT G Codes:        Trino Higinbotham H. Owens Shark, PT, DPT, NCS 06/03/16, 11:10 PM [phone]

## 2016-06-04 LAB — BASIC METABOLIC PANEL
Anion gap: 7 (ref 5–15)
BUN: 44 mg/dL — AB (ref 6–20)
CALCIUM: 9 mg/dL (ref 8.9–10.3)
CO2: 29 mmol/L (ref 22–32)
Chloride: 101 mmol/L (ref 101–111)
Creatinine, Ser: 0.59 mg/dL (ref 0.44–1.00)
GFR calc Af Amer: >60 mL/min (ref >60)
GLUCOSE: 115 mg/dL — AB (ref 65–99)
POTASSIUM: 4.4 mmol/L (ref 3.5–5.1)
Sodium: 137 mmol/L (ref 135–145)

## 2016-06-04 MED ORDER — METOPROLOL TARTRATE 25 MG PO TABS
12.5000 mg | ORAL_TABLET | Freq: Two times a day (BID) | ORAL | Status: DC
  Administered 2016-06-04 – 2016-06-05 (×2): 12.5 mg via ORAL
  Filled 2016-06-04 (×2): qty 1

## 2016-06-04 NOTE — Progress Notes (Signed)
SATURATION QUALIFICATIONS: (This note is used to comply with regulatory documentation for home oxygen)  Patient Saturations on Room Air at Rest = 87%  Patient Saturations on Room Air while Ambulating = n/a%  Patient Saturations on 2 Liters of oxygen while resting = 93%  Please briefly explain why patient needs home oxygen: 

## 2016-06-04 NOTE — Progress Notes (Signed)
[facility] Encounter Note  Patient: Mallory Rios / Admit Date: 06/01/2016 / Date of Encounter: 06/04/2016, 6:19 AM   Subjective: Patient continues to have some shortness of breath cough and congestion. This is overall improved since admission. Heart rate reasonably well-controlled with atrial fibrillation. Echocardiogram with normal LV systolic function despite previous myocardial infarction and ejection fraction of 50%. Valvular heart disease likely contributing to shortness of breath  Review of Systems: Positive for: Shortness of breath cough congestion Negative for: Vision change, hearing change, syncope, dizziness, nausea, vomiting,diarrhea, bloody stool, stomach pain, positive for cough, congestion, negative for diaphoresis, urinary frequency, urinary pain,skin lesions, skin rashes Others previously listed  Objective: Telemetry: Atrial fibrillation with controlled ventricular rate Physical Exam: Blood pressure 127/64, pulse 81, temperature 97.9 F (36.6 C), resp. rate 14, height 4\' 8"  (1.422 m), weight 52.2 kg (115 lb 1.6 oz), SpO2 99 %. Body mass index is 25.8 kg/m. General: Well developed, well nourished, in no acute distress. Head: Normocephalic, atraumatic, sclera non-icteric, no xanthomas, nares are without discharge. Neck: No apparent masses Lungs: Normal respirations with no wheezes, few rhonchi, no rales , basilar crackles   Heart: Irregular rate and rhythm, normal S1 S2, apical murmur, no rub, no gallop, PMI is normal size and placement, carotid upstroke normal without bruit, jugular venous pressure normal Abdomen: Soft, non-tender, non-distended with normoactive bowel sounds. No hepatosplenomegaly. Abdominal aorta is normal size without bruit Extremities: Trace edema, no clubbing, no cyanosis, no ulcers,  Peripheral: 2+ radial, 2+ femoral, 2+ dorsal pedal pulses Neuro: Alert and oriented. Moves all extremities spontaneously. Psych:  Responds to  questions appropriately with a normal affect.   Intake/Output Summary (Last 24 hours) at 06/04/16 0619 Last data filed at 06/03/16 2300  Gross per 24 hour  Intake             1040 ml  Output             1300 ml  Net             -260 ml    Inpatient Medications:  . acetaminophen  1,000 mg Oral BID  . aspirin EC  81 mg Oral Daily  . atorvastatin  40 mg Oral q1800  . clopidogrel  75 mg Oral Daily  . enoxaparin (LOVENOX) injection  40 mg Subcutaneous Q24H  . famotidine  20 mg Oral QHS  . ferrous sulfate  325 mg Oral Q breakfast  . furosemide  20 mg Intravenous BID  . pantoprazole  40 mg Oral BID  . sodium chloride flush  3 mL Intravenous Q12H  . vitamin C  500 mg Oral Daily  . vitamin E  400 Units Oral Daily   Infusions:   Labs:  Recent Labs  06/03/16 0504 06/04/16 0351  NA 136 137  K 4.1 4.4  CL 102 101  CO2 26 29  GLUCOSE 109* 115*  BUN 38* 44*  CREATININE 0.65 0.59  CALCIUM 9.1 9.0   No results for input(s): AST, ALT, ALKPHOS, BILITOT, PROT, ALBUMIN in the last 72 hours.  Recent Labs  06/03/16 0504  WBC 9.9  HGB 12.1  HCT 36.2  MCV 90.6  PLT 254    Recent Labs  06/01/16 1013 06/01/16 1336 06/01/16 2111  TROPONINI <0.03 <0.03 <0.03   Invalid input(s): POCBNP No results for input(s): HGBA1C in the last 72 hours.   Weights: Filed Weights   06/02/16 0455 06/03/16 0418 06/04/16 0353  Weight: 52.8 kg (116 lb 4.8 oz) 50.9  kg (112 lb 3.2 oz) 52.2 kg (115 lb 1.6 oz)     Radiology/Studies:  Dg Chest 2 View  Result Date: 06/01/2016 CLINICAL DATA:  Palpitations and dyspnea tonight. EXAM: CHEST  2 VIEW COMPARISON:  03/11/2016 FINDINGS: Stable cardiomegaly and hyperinflation. There are small pleural effusions bilaterally, new. Mild interstitial prominence may represent a degree of interstitial edema. No confluent airspace opacities. Hiatal hernia noted. IMPRESSION: Stable hyperinflation and cardiomegaly, with superimposed interstitial edema and small pleural  effusions. Electronically Signed   By: Andreas Newport M.D.   On: 06/01/2016 03:53     Assessment and Recommendation  83 y.o. female with acute combined systolic diastolic dysfunction congestive heart failure with history of old myocardial infarction essential hypertension with exacerbation of paroxysmal nonvalvular atrial fibrillation with rapid ventricular rate and no current evidence of myocardial infarction 1. Continue to follow very closely for heart rate control and need for addition of low-dose beta blocker for atrial fibrillation prostate contributing to heart failure with a goal heart rate between 60 and 80 bpm at rest 2. Continue diuresis for pulmonary edema and heart failure and change to oral medication at this time 3. High intensity cholesterol therapy for further risk reduction cardiovascular event and previous coronary artery disease but no current evidence of myocardial infarction and/or ischemia or angina 4. Further consideration of anticoagulation instead of antiplatelet medication management for risk reduction in stroke with atrial fibrillation 5. Begin ambulation and follow for need for adjustments of medications listed above and possible discharge to home with follow-up next week for further adjustments of treatment 6. No further cardiac diagnostics necessary at this time  Signed, Serafina Royals M.D. FACC

## 2016-06-04 NOTE — Care Management (Signed)
Spoke with husband, Zakyria Metzinger [phone]) regarding discharge planning. Explained the benefit from home health nursing and alist of providers.  He is not interested in a physical therapist at all. " I dont want her jerked around". He wants to speak to his wife regarding home health and then decide if they want it. New CHF on acute O2. Will pass on to weekend SW for follow up.

## 2016-06-04 NOTE — Progress Notes (Signed)
[facility] [place] NAME: Mallory Rios    MR#:  [id number]  DATE OF BIRTH:  10-06-1927  SUBJECTIVE:  -4.7 L of fluid balance, she has not yet ambulated much, waiting for placement, blood pressure has somewhat improved REVIEW OF SYSTEMS:   Review of Systems  Constitutional: Positive for malaise/fatigue. Negative for chills, fever and weight loss.  HENT: Negative for nosebleeds and sore throat.   Eyes: Negative for blurred vision.  Respiratory: Positive for shortness of breath. Negative for cough and wheezing.   Cardiovascular: Negative for chest pain, orthopnea, leg swelling and PND.  Gastrointestinal: Negative for abdominal pain, constipation, diarrhea, heartburn, nausea and vomiting.  Genitourinary: Negative for dysuria and urgency.  Musculoskeletal: Negative for back pain.  Skin: Negative for rash.  Neurological: Positive for weakness. Negative for dizziness, speech change, focal weakness and headaches.  Endo/Heme/Allergies: Does not bruise/bleed easily.  Psychiatric/Behavioral: Negative for depression.   Tolerating Diet:yes Tolerating PT: Short-term rehab/skilled nursing facility  DRUG ALLERGIES:   Allergies  Allergen Reactions  . Demerol [Meperidine]   . Other Other (See Comments)  . Propoxyphene    VITALS:  Blood pressure (!) 108/52, pulse 84, temperature 98.3 F (36.8 C), temperature source Oral, resp. rate 16, height 4\' 8"  (1.422 m), weight 52.2 kg (115 lb 1.6 oz), SpO2 93 %. PHYSICAL EXAMINATION:   Physical Exam  Constitutional: She is oriented to person, place, and time and well-developed, well-nourished, and in no distress.  HENT:  Head: Normocephalic and atraumatic.  Eyes: Conjunctivae and EOM are normal. Pupils are equal, round, and reactive to light.  Neck: Normal range of motion. Neck supple. No tracheal deviation present. No thyromegaly present.  Cardiovascular: Normal rate, regular rhythm and normal  heart sounds.   Pulmonary/Chest: No accessory muscle usage. No respiratory distress. She has decreased breath sounds in the right lower field and the left lower field. She has no wheezes. She has no rales. She exhibits no tenderness.  Abdominal: Soft. Bowel sounds are normal. She exhibits no distension. There is no tenderness.  Musculoskeletal: Normal range of motion.  Neurological: She is alert and oriented to person, place, and time. No cranial nerve deficit.  Skin: Skin is warm and dry. No rash noted.  Psychiatric: Mood and affect normal.   LABORATORY PANEL:  CBC  Recent Labs Lab 06/03/16 0504  WBC 9.9  HGB 12.1  HCT 36.2  PLT 254    Chemistries   Recent Labs Lab 06/04/16 0351  NA 137  K 4.4  CL 101  CO2 29  GLUCOSE 115*  BUN 44*  CREATININE 0.59  CALCIUM 9.0   Cardiac Enzymes  Recent Labs Lab 06/01/16 2111  TROPONINI <0.03   RADIOLOGY:  No results found. ASSESSMENT AND PLAN:  83 year old elderly female patient with history of atrial fibrillation, hypertension, coronary artery disease, myocardial infarction presented to the emergency room with increased shortness of breath and swelling in both lower extremities. Chest x-ray revealed vascular congestion and edema.  1. Acute diastolic congestive heart failure -ECHO shows EF of 50-55% -Strict I and O, daily weights. Neg 4.7 liters -Cardiology input appreciated  *Hypotension with a history of hypertension  - holding metoprolol and lisinopril.  -Started on IV Lasix 20 mg twice daily -Monitor blood pressure  * Atrial fibrillation, chornic appears to be stable -Cardio input appreciated -Unable to give any rate controlling medication due to hypotension -May not be a good candidate for long-term anticoagulation - high risk for falls  *  H/o CAD  Cont plavix and statins  * GERD  -PPI bid and zantac at bedtime    Case discussed with Care Management/Social Worker. Management plans discussed with the  patient, nursing and they are in agreement.  CODE STATUS: Full  DVT Prophylaxis: lovenox  TOTAL TIME TAKING CARE OF THIS PATIENT:30 minutes.   >50% time spent on counselling and coordination of care  POSSIBLE D/C IN 1-2 DAYS, DEPENDING ON CLINICAL CONDITION.  Waiting for placement.  Social worker is unable to get in touch with family yet to make decision for placement.  Note: This dictation was prepared with Dragon dictation along with smaller phrase technology. Any transcriptional errors that result from this process are unintentional.  Max Sane M.D on 06/04/2016 at 1:08 PM  Between 7am to 6pm - Pager - [phone]  After 6pm go to [email or web address] - password [email or web address] [facility]  Office  [phone]  CC: Primary care physician; No PCP Per Patient

## 2016-06-04 NOTE — Care Management (Signed)
PT recommending STR at DC. CSW updated and consult put in

## 2016-06-04 NOTE — Care Management Important Message (Signed)
Important Message  Patient Details  Name: SINTIA MCKISSIC MRN: [id number] Date of Birth: [birth year]   Medicare Important Message Given:  Yes    Jolly Mango, RN 06/04/2016, 9:33 AM

## 2016-06-04 NOTE — Clinical Social Work Note (Signed)
Clinical Social Work Assessment  Patient Details  Name: MAITLYN PENZA MRN: [id number] Date of Birth: [birth year]  Date of referral:  06/04/16               Reason for consult:  Facility Placement                Permission sought to share information with:  Family Supports, Customer service manager Permission granted to share information::  Yes, Verbal Permission Granted  Name::     Amariss, [place] Spouse   [phone] Marisue [place]   [phone]   Agency::  SNF admissions  Relationship::     Contact Information:     Housing/Transportation Living arrangements for the past 2 months:  Single Family Home Source of Information:  Patient Patient Interpreter Needed:  None Criminal Activity/Legal Involvement Pertinent to Current Situation/Hospitalization:  No - Comment as needed Significant Relationships:  Spouse Lives with:  Spouse Do you feel safe going back to the place where you live?  No Need for family participation in patient care:  Yes (Comment) (Patient requests to speak with her husband.)  Care giving concerns:  Patient feel she needs some rehab before she can return back home.   Social Worker assessment / plan:  Patient is a 83 year old female who is alert and oriented x4 and able to express her feelings.  Patient is married and lives with her husband, patient states she has been to rehab in the past, and she would not mind returning, however patient states her husband does not want her to go to rehab.  CSW explained role of social worker and process for looking for a SNF for short term rehab.  Patient was explained how insurance will pay for her stay and also a reminder of what to expect at SNF.  Patient stated she would like CSW to speak with her husband to decide on going to SNF or not.  Patient gave CSW permission to begin bed search for SNF in [facility].  Employment status:  Retired Nurse, adult PT Recommendations:  [place] / Referral to community resources:  [place]  Patient/Family's Response to care: Patient in agreement to going to SNF, but she said her husband does not want her to, awaiting call from husband.  Patient/Family's Understanding of and Emotional Response to Diagnosis, Current Treatment, and Prognosis:  Patient is aware of current treatment plan and diagnosis.  Emotional Assessment Appearance:  Appears stated age Attitude/Demeanor/Rapport:    Affect (typically observed):  Calm, Stable, Pleasant Orientation:  Oriented to Self, Oriented to Place, Oriented to  Time, Oriented to Situation Alcohol / Substance use:  Not Applicable Psych involvement (Current and /or in the community):  No (Comment)  Discharge Needs  Concerns to be addressed:  Lack of Support Readmission within the last 30 days:  No Current discharge risk:  Lack of support system Barriers to Discharge:  Continued Medical Work up   Ross Ludwig, [place] 06/04/2016, 11:45 AM

## 2016-06-04 NOTE — Plan of Care (Signed)
Problem: Cardiac: Goal: Ability to achieve and maintain adequate cardiopulmonary perfusion will improve Outcome: Progressing Oxygen administered to maintain oxygen greater than 92%.

## 2016-06-04 NOTE — Progress Notes (Signed)
Speech Language Pathology Treatment: Dysphagia  Patient Details Name: NYAH SHEPHERD MRN: [id number] DOB: 12/30/1927 Today's Date: 06/04/2016 Time: 1497-0263 SLP Time Calculation (min) (ACUTE ONLY): 47 min  Assessment / Plan / Recommendation Clinical Impression  Pt seen today for toleration of current Dysphagia level 3(Mech soft) w/ thin liquids as well as education on general aspiration and REFLUX precautions. Pt has a known h/o Hiatal Hernia and Reflux both of which can lead to dysmotility of the Esophagus and then impact the pharyngeal phase of swallowing even increasing risk for aspiration of REFLUX material thus impacting the Pulmonary system.  Pt appeared to present w/ adequate oropharyngeal phase swallow function and appears at reduced risk for aspiration w/ oral intake from an oropharyngeal phase standpoint when following general aspiration precautions and taking her time to avoid any SOB/WOB. Pt appeared to present w/ no gross oropharyngeal phase dysphagia during po trials; no overt s/s of aspiration noted w/ trials of thin liquids VIA CUP, and adequate bolus management and oral clearing noted w/ all consistencies. Pt required min extra time w/ po trials to avoid increased exertion but moreso to allow time for Esophageal clearing - frequent belching was noted. Recommend a Dysphagia level 3 diet w/ thin liquids via CUP initially w/ general aspiration precautions; STRICT REFLUX PRECAUTIONS; continue PPI and consult MD re: for need to adjust for better Esophageal/Reflux management; consider GI consult; Pills swallowed w/ puree/Applesauce; rest breaks during meals and frequent, smaller meals to lessen overly full feelings. Recommend f/u w/ ENT for assessment of vocal cords secondary to Dysphonia currently(as pt desires f/u) - suspect the dysphonia could be related to potential ongoing Reflux. Handouts given on general aspiration precautions; more on REFLUX precautions. NSG to reconsult if any  change in status while admitted.  SLP Visit Diagnosis: Dysphagia, pharyngoesophageal phase (R13.14) (Esophageal phase dysmotility)    HPI HPI: Pt is a 83 y.o. female with a known history of Acid Reflux w/ moderate phlegm and on PPI for several years, atrial fibrillation, hypertension, myocardial infarction, scoliosis, and Hiatal Hernia presented to the emergency room palpitations and difficulty breathing. Patient felt short of breath for the last 1 week and does not use any home oxygen. Yesterday she had a lot of palpitations and she took her metoprolol and then felt better. She also noticed increased swelling in both the lower legs for the last 1 week. Complains of orthopnea. No complaints of any chest pain. Patient was evaluated in the emergency room chest x-ray showed a fluid overload, congestion and edema. She was given IV Lasix in the emergency room for diuresis. Per chart review, pt does have a notation of Hiatal Hernia per CXRs, as well as significant acid reflux on PPI, both not noted in her H&P. Pt indicated significant impact from the acid reflux at home. NSG had concern of pt's swallowing w/ thin liquids initially when attempting to swallowing pills. Pt has been swallowing pills w/ Applesauce appropriately since. Pt has been tolerating her current diet per report/notes.       SLP Plan  All goals met       Recommendations  Diet recommendations: Dysphagia 3 (mechanical soft);Thin liquid Liquids provided via: Cup;No straw Medication Administration: Whole meds with puree (for easier swallowing and clearing of the Esophagus) Supervision: Patient able to self feed Compensations: Minimize environmental distractions;Slow rate;Small sips/bites;Lingual sweep for clearance of pocketing;Multiple dry swallows after each bite/sip;Follow solids with liquid Postural Changes and/or Swallow Maneuvers: Seated upright 90 degrees;Upright 30-60 min after meal (REFLUX Precautions)  General recommendations:  (Dietician f/u; GI f/u for REFLUX management) Oral Care Recommendations: Oral care BID;Staff/trained caregiver to provide oral care Follow up Recommendations: None SLP Visit Diagnosis: Dysphagia, pharyngoesophageal phase (R13.14) Plan: All goals met       GO                 Orinda Kenner, MS, CCC-SLP Watson,Katherine 06/04/2016, 2:12 PM

## 2016-06-04 NOTE — Clinical Social Work Placement (Signed)
   CLINICAL SOCIAL WORK PLACEMENT  NOTE  Date:  06/04/2016  Patient Details  Name: Mallory Rios MRN: [id number] Date of Birth: March 14, 1928  Clinical Social Work is seeking post-discharge placement for this patient at the [place] level of care (*CSW will initial, date and re-position this form in  chart as items are completed):  Yes   Patient/family provided with [place] Work Department's list of facilities offering this level of care within the geographic area requested by the patient (or if unable, by the patient's family).  Yes   Patient/family informed of their freedom to choose among providers that offer the needed level of care, that participate in Medicare, Medicaid or managed care program needed by the patient, have an available bed and are willing to accept the patient.  Yes   Patient/family informed of [facility]'s ownership interest in [facility], as well as of the fact that they are under no obligation to receive care at these facilities.  PASRR submitted to EDS on 06/03/16     PASRR number received on 06/03/16     Existing PASRR number confirmed on       FL2 transmitted to all facilities in geographic area requested by pt/family on 06/03/16     FL2 transmitted to all facilities within larger geographic area on       Patient informed that his/her managed care company has contracts with or will negotiate with certain facilities, including the following:            Patient/family informed of bed offers received.  Patient chooses bed at       Physician recommends and patient chooses bed at      Patient to be transferred to   on  .  Patient to be transferred to facility by       Patient family notified on   of transfer.  Name of family member notified:        PHYSICIAN Please sign FL2     Additional Comment:    _______________________________________________ Ross Ludwig, LCSWA 06/04/2016,  11:56 AM

## 2016-06-04 NOTE — Clinical Social Work Note (Addendum)
CSW spoke to patient and her husband to discuss SNF placement option.  Patient's husband stated that patient's base line is only walking 5-6 feet due to her scoliosis.  Patient's husband stated he does not want to have patient go to SNF, he would rather have home health.  CSW explained to the patient's husband the differences and benefits of going to SNF verse going home with home health, and he still chose home health.  Patient's husband expressed that the oxygen is new for him and he is concerned about it, CSW explained that the home health agency and oxygen company can assist with showing him how to use it.  Patient's husband stated they do have a couple of private caregivers coming to the home, and he is willing to pay for more if he needs to.  CSW informed patient that a home health social worker can be assigned to see patient at home as well to help with extra care providers or other community resources available.  Patient's husband expressed he would agree to having home health social worker come as well as the other disciplines.  CSW updated case manager and physician, [place] signing off.  Jones Broom. [place], MSW, [place]  06/04/2016 2:41 PM

## 2016-06-04 NOTE — Clinical Social Work Note (Signed)
CSW attempted to call patient's husband to discuss SNF bed offers, there was no answer, and no voice mail.  CSW will try again at a later time.  Jones Broom. [place], MSW, [place]  06/04/2016 11:41 AM

## 2016-06-05 LAB — BASIC METABOLIC PANEL
ANION GAP: 8 (ref 5–15)
BUN: 35 mg/dL — ABNORMAL HIGH (ref 6–20)
CALCIUM: 8.8 mg/dL — AB (ref 8.9–10.3)
CO2: 29 mmol/L (ref 22–32)
Chloride: 99 mmol/L — ABNORMAL LOW (ref 101–111)
Creatinine, Ser: 0.44 mg/dL (ref 0.44–1.00)
Glucose, Bld: 105 mg/dL — ABNORMAL HIGH (ref 65–99)
Potassium: 4.4 mmol/L (ref 3.5–5.1)
Sodium: 136 mmol/L (ref 135–145)

## 2016-06-05 MED ORDER — METOPROLOL TARTRATE 25 MG PO TABS: 12.5000 mg | ORAL_TABLET | Freq: Two times a day (BID) | ORAL | Status: DC

## 2016-06-05 MED ORDER — FUROSEMIDE 20 MG PO TABS: 20.0000 mg | ORAL_TABLET | Freq: Every day | ORAL | Status: DC

## 2016-06-05 MED ORDER — METOPROLOL TARTRATE 25 MG PO TABS: 12.5000 mg | ORAL_TABLET | Freq: Two times a day (BID) | ORAL | 0 refills | Status: AC

## 2016-06-05 MED ORDER — LISINOPRIL 2.5 MG PO TABS: 2.5000 mg | ORAL_TABLET | Freq: Every day | ORAL | 0 refills | Status: DC

## 2016-06-05 MED ORDER — FAMOTIDINE 20 MG PO TABS: 20.0000 mg | ORAL_TABLET | Freq: Every day | ORAL | 0 refills | Status: AC

## 2016-06-05 MED ORDER — LISINOPRIL 5 MG PO TABS
2.5000 mg | ORAL_TABLET | Freq: Every day | ORAL | Status: DC
  Administered 2016-06-05: 2.5 mg via ORAL
  Filled 2016-06-05: qty 1

## 2016-06-05 NOTE — Progress Notes (Signed)
Patient discharged home escorted by staff member and spouse via wheel chair,vital signs within normal limits upon discharge.

## 2016-06-05 NOTE — Care Management Note (Signed)
Case Management Note  Patient Details  Name: FANTASHA DANIELE MRN: [id number] Date of Birth: 15-Nov-1927  Subjective/Objective:      A referral for new oxygen was called to [facility] at [facility] with a request for a portable 02 tank to be delivered to Mrs [facility] room 246 today followed by a home oxygen set-up. Call to Mr Resch at home and he apparently did not expect Mrs Klare to be discharged home until next week. He is currently seeking transportation for Mrs Zurcher to return home today. Discussed new oxygen with him and that a referral for home health RN would be made to which ever home health agency would accept [facility] insurance.               Action/Plan:   Expected Discharge Date:  06/05/16               Expected Discharge Plan:     In-House Referral:     Discharge planning Services     Post Acute Care Choice:    Choice offered to:     DME Arranged:    DME Agency:     HH Arranged:    HH Agency:     Status of Service:     If discussed at [employer], dates discussed:    Additional Comments:  Aylinn Rydberg A, RN 06/05/2016, 11:54 AM

## 2016-06-05 NOTE — Progress Notes (Signed)
SATURATION QUALIFICATIONS: (This note is used to comply with regulatory documentation for home oxygen)  Patient Saturations on Room Air at Rest = 87 %  Patient Saturations on Room Air while Ambulating = unable to obtain, non-ambulatory at this point %  Patient Saturations on 2 Liters of oxygen while Ambulating = 93 %  Please briefly explain why patient needs home oxygen:  CHF 02 Sats obtained by Thomas Hoff RN on 06/04/16 at 9:10am

## 2016-06-05 NOTE — Discharge Summary (Signed)
[facility] [place] NAME: Mallory Rios    MR#:  [id number]  DATE OF BIRTH:  September 25, 1927  DATE OF ADMISSION:  06/01/2016 ADMITTING PHYSICIAN: Saundra Shelling, MD  DATE OF DISCHARGE: 06/05/2016  PRIMARY CARE PHYSICIAN: Valera Castle, MD    ADMISSION DIAGNOSIS:  Acute on chronic congestive heart failure, unspecified congestive heart failure type ([place]) [I50.9]  DISCHARGE DIAGNOSIS:  Acute on chronic CHF, diastolic Relative hypotension-resolved SECONDARY DIAGNOSIS:   Past Medical History:  Diagnosis Date  . A-fib ([place])   . Cancer ([place])   . Hypertension   . MI (myocardial infarction)     HOSPITAL COURSE:  infarction presented to the emergency room with increased shortness of breath and swelling in both lower extremities.Chest x-ray revealed vascular congestion and edema.  1.Acute diastolic congestive heart failure -ECHO shows EF of 50-55% -Strict I and O, daily weights. Neg 4.7 liters -Cardiology input appreciated -change to oral diuretics  *Hypotension with a history of hypertension ---improved BP 119/94 - resume low dose metoprolol and lisinopril.   * Atrial fibrillation, chornic appears to be stable -Cardio input appreciated -Unable to give any rate controlling medication due to hypotension -May not be a good candidate for long-term anticoagulation - high risk for falls  * H/o CAD  Cont plavix and statins  * GERD  -PPI bid and zantac at bedtime  * D/c to home with oxygen and HHRN. Husband declined PT since PT does not do much walking due to her back issues  Spoke with Corrie Mckusick on the phone CONSULTS OBTAINED:  Treatment Team:  Corey Skains, MD  DRUG ALLERGIES:   Allergies  Allergen Reactions  . Demerol [Meperidine]   . Other Other (See Comments)  . Propoxyphene     DISCHARGE MEDICATIONS:   Current Discharge Medication List    START taking these medications   Details   famotidine (PEPCID) 20 MG tablet Take 1 tablet (20 mg total) by mouth at bedtime. Qty: 30 tablet, Refills: 0      CONTINUE these medications which have CHANGED   Details  lisinopril (PRINIVIL,ZESTRIL) 2.5 MG tablet Take 1 tablet (2.5 mg total) by mouth daily. Qty: 30 tablet, Refills: 0    metoprolol tartrate (LOPRESSOR) 25 MG tablet Take 0.5 tablets (12.5 mg total) by mouth 2 (two) times daily. Qty: 60 tablet, Refills: 0      CONTINUE these medications which have NOT CHANGED   Details  atorvastatin (LIPITOR) 40 MG tablet Take 40 mg by mouth daily at 6 PM.     clopidogrel (PLAVIX) 75 MG tablet Take 75 mg by mouth daily.     nitroGLYCERIN (NITROSTAT) 0.4 MG SL tablet Place 0.4 mg under the tongue every 5 (five) minutes as needed for chest pain.    pantoprazole (PROTONIX) 40 MG tablet Take 40 mg by mouth daily.     acetaminophen (TYLENOL) 325 MG tablet Take 650 mg by mouth every 6 (six) hours as needed.    Ascorbic Acid (VITAMIN C) 500 MG CAPS Take 500 mg by mouth daily.    aspirin 81 MG EC tablet Take 81 mg by mouth daily.    chlorpheniramine-HYDROcodone (TUSSIONEX PENNKINETIC ER) 10-8 MG/5ML SUER Take 5 mLs by mouth at bedtime as needed for cough. Qty: 50 mL, Refills: 0    ferrous sulfate 325 (65 FE) MG tablet Take 325 mg by mouth daily with breakfast.    furosemide (LASIX) 20 MG tablet Take 1 tablet (20 mg  total) by mouth daily. Qty: 3 tablet, Refills: 0    ondansetron (ZOFRAN ODT) 4 MG disintegrating tablet Take 1 tablet (4 mg total) by mouth every 8 (eight) hours as needed for nausea or vomiting. Qty: 20 tablet, Refills: 0    vitamin E 400 UNIT capsule Take 400 Units by mouth daily.        If you experience worsening of your admission symptoms, develop shortness of breath, life threatening emergency, suicidal or homicidal thoughts you must seek medical attention immediately by calling 911 or calling your MD immediately  if symptoms less severe.  You Must read  complete instructions/literature along with all the possible adverse reactions/side effects for all the Medicines you take and that have been prescribed to you. Take any new Medicines after you have completely understood and accept all the possible adverse reactions/side effects.   Please note  You were cared for by a hospitalist during your hospital stay. If you have any questions about your discharge medications or the care you received while you were in the hospital after you are discharged, you can call the unit and asked to speak with the hospitalist on call if the hospitalist that took care of you is not available. Once you are discharged, your primary care physician will handle any further medical issues. Please note that NO REFILLS for any discharge medications will be authorized once you are discharged, as it is imperative that you return to your primary care physician (or establish a relationship with a primary care physician if you do not have one) for your aftercare needs so that they can reassess your need for medications and monitor your lab values. Today   SUBJECTIVE   Doing  well  VITAL SIGNS:  Blood pressure (!) 119/94, pulse 97, temperature 98.5 F (36.9 C), temperature source Oral, resp. rate 16, height 4\' 8"  (1.422 m), weight 51.7 kg (114 lb), SpO2 100 %.  I/O:   Intake/Output Summary (Last 24 hours) at 06/05/16 1055 Last data filed at 06/05/16 1040  Gross per 24 hour  Intake              240 ml  Output             2150 ml  Net            -1910 ml    PHYSICAL EXAMINATION:  GENERAL:  83 y.o.-year-old patient lying in the bed with no acute distress.  EYES: Pupils equal, round, reactive to light and accommodation. No scleral icterus. Extraocular muscles intact.  HEENT: Head atraumatic, normocephalic. Oropharynx and nasopharynx clear.  NECK:  Supple, no jugular venous distention. No thyroid enlargement, no tenderness.  LUNGS: Normal breath sounds bilaterally, no  wheezing, rales,rhonchi or crepitation. No use of accessory muscles of respiration.  CARDIOVASCULAR: S1, S2 normal. No murmurs, rubs, or gallops.  ABDOMEN: Soft, non-tender, non-distended. Bowel sounds present. No organomegaly or mass.  EXTREMITIES: No pedal edema, cyanosis, or clubbing.  NEUROLOGIC: Cranial nerves II through XII are intact. Muscle strength 5/5 in all extremities. Sensation intact. Gait not checked.  PSYCHIATRIC:  patient is alert and oriented x 3.  SKIN: No obvious rash, lesion, or ulcer.   DATA REVIEW:   CBC   Recent Labs Lab 06/03/16 0504  WBC 9.9  HGB 12.1  HCT 36.2  PLT 254    Chemistries   Recent Labs Lab 06/05/16 0548  NA 136  K 4.4  CL 99*  CO2 29  GLUCOSE 105*  BUN 35*  CREATININE 0.44  CALCIUM 8.8*    Microbiology Results   No results found for this or any previous visit (from the past 240 hour(s)).  RADIOLOGY:  No results found.   Management plans discussed with the patient, family and they are in agreement.  CODE STATUS:     Code Status Orders        Start     Ordered   06/01/16 0704  Full code  Continuous     06/01/16 0703    Code Status History    Date Active Date Inactive Code Status Order ID Comments User Context   This patient has a current code status but no historical code status.      TOTAL TIME TAKING CARE OF THIS PATIENT: 40 minutes.    Corderro Koloski M.D on 06/05/2016 at 10:55 AM  Between 7am to 6pm - Pager - [phone] After 6pm go to [email or web address] - password [email or web address] [facility]  Office  [phone]  CC: Primary care physician; Valera Castle, MD

## 2016-06-05 NOTE — Plan of Care (Signed)
Problem: Cardiac: Goal: Ability to achieve and maintain adequate cardiopulmonary perfusion will improve Outcome: Progressing Oxygen saturations maintained >93% on 2LNC this shift.

## 2016-06-05 NOTE — Care Management Note (Signed)
Case Management Note  Patient Details  Name: SHAUNA BODKINS MRN: [id number] Date of Birth: Dec 31, 1927  Subjective/Objective:    This Probation officer spoke with Mr Kitch on the phone this morning and he reported that he was seeking transportation home for Mrs Marulanda. Daughter who usually transports Mrs Revell is out of town. [facility] has delivered a portable oxygen tank to Mrs [facility] room and Malachy Mood at [place] has accepted a HH-RN referral for Mrs Lesueur with start of care on Monday 06/07/16.                 Action/Plan:   Expected Discharge Date:  06/05/16               Expected Discharge Plan:     In-House Referral:     Discharge planning Services     Post Acute Care Choice:    Choice offered to:     DME Arranged:    DME Agency:     HH Arranged:    HH Agency:     Status of Service:     If discussed at [employer], dates discussed:    Additional Comments:  Jannett Schmall A, RN 06/05/2016, 4:32 PM

## 2016-06-08 ENCOUNTER — Ambulatory Visit: Payer: Medicare Other | Admitting: Family

## 2016-06-10 ENCOUNTER — Ambulatory Visit: Payer: Medicare Other | Admitting: Family

## 2017-07-23 ENCOUNTER — Inpatient Hospital Stay
Admission: EM | Admit: 2017-07-23 | Discharge: 2017-07-28 | DRG: 291 | Disposition: A | Discharge status: Home Health | Payer: Medicare Other | Attending: Family Medicine | Admitting: Family Medicine

## 2017-07-23 ENCOUNTER — Emergency Department: Payer: Medicare Other

## 2017-07-23 ENCOUNTER — Other Ambulatory Visit: Payer: Self-pay

## 2017-07-23 DIAGNOSIS — J810 Acute pulmonary edema: Secondary | ICD-10-CM

## 2017-07-23 DIAGNOSIS — J9601 Acute respiratory failure with hypoxia: Secondary | ICD-10-CM | POA: Diagnosis present

## 2017-07-23 DIAGNOSIS — Z90710 Acquired absence of both cervix and uterus: Secondary | ICD-10-CM

## 2017-07-23 DIAGNOSIS — Z885 Allergy status to narcotic agent status: Secondary | ICD-10-CM | POA: Diagnosis not present

## 2017-07-23 DIAGNOSIS — I252 Old myocardial infarction: Secondary | ICD-10-CM | POA: Diagnosis not present

## 2017-07-23 DIAGNOSIS — I5033 Acute on chronic diastolic (congestive) heart failure: Secondary | ICD-10-CM | POA: Diagnosis present

## 2017-07-23 DIAGNOSIS — D509 Iron deficiency anemia, unspecified: Secondary | ICD-10-CM | POA: Diagnosis present

## 2017-07-23 DIAGNOSIS — I340 Nonrheumatic mitral (valve) insufficiency: Secondary | ICD-10-CM | POA: Diagnosis present

## 2017-07-23 DIAGNOSIS — I4510 Unspecified right bundle-branch block: Secondary | ICD-10-CM | POA: Diagnosis present

## 2017-07-23 DIAGNOSIS — Z79899 Other long term (current) drug therapy: Secondary | ICD-10-CM

## 2017-07-23 DIAGNOSIS — E876 Hypokalemia: Secondary | ICD-10-CM | POA: Diagnosis not present

## 2017-07-23 DIAGNOSIS — Z7982 Long term (current) use of aspirin: Secondary | ICD-10-CM | POA: Diagnosis not present

## 2017-07-23 DIAGNOSIS — Z955 Presence of coronary angioplasty implant and graft: Secondary | ICD-10-CM | POA: Diagnosis not present

## 2017-07-23 DIAGNOSIS — I2510 Atherosclerotic heart disease of native coronary artery without angina pectoris: Secondary | ICD-10-CM | POA: Diagnosis present

## 2017-07-23 DIAGNOSIS — Z9181 History of falling: Secondary | ICD-10-CM

## 2017-07-23 DIAGNOSIS — I482 Chronic atrial fibrillation: Secondary | ICD-10-CM | POA: Diagnosis present

## 2017-07-23 DIAGNOSIS — J9600 Acute respiratory failure, unspecified whether with hypoxia or hypercapnia: Secondary | ICD-10-CM | POA: Diagnosis present

## 2017-07-23 DIAGNOSIS — I110 Hypertensive heart disease with heart failure: Principal | ICD-10-CM | POA: Diagnosis present

## 2017-07-23 LAB — COMPREHENSIVE METABOLIC PANEL
ALBUMIN: 3.5 g/dL (ref 3.5–5.0)
ALT: 12 U/L — ABNORMAL LOW (ref 14–54)
ANION GAP: 9 (ref 5–15)
AST: 21 U/L (ref 15–41)
Alkaline Phosphatase: 74 U/L (ref 38–126)
BUN: 24 mg/dL — AB (ref 6–20)
CHLORIDE: 103 mmol/L (ref 101–111)
CO2: 27 mmol/L (ref 22–32)
Calcium: 8.5 mg/dL — ABNORMAL LOW (ref 8.9–10.3)
Creatinine, Ser: 0.54 mg/dL (ref 0.44–1.00)
GFR calc Af Amer: >60 mL/min (ref >60)
GFR calc non Af Amer: >60 mL/min (ref >60)
GLUCOSE: 107 mg/dL — AB (ref 65–99)
Potassium: 3.6 mmol/L (ref 3.5–5.1)
SODIUM: 139 mmol/L (ref 135–145)
Total Bilirubin: 0.6 mg/dL (ref 0.3–1.2)
Total Protein: 6.8 g/dL (ref 6.5–8.1)

## 2017-07-23 LAB — URINALYSIS, COMPLETE (UACMP) WITH MICROSCOPIC
Bacteria, UA: NONE SEEN
Bilirubin Urine: NEGATIVE
Glucose, UA: NEGATIVE mg/dL
Hgb urine dipstick: NEGATIVE
KETONES UR: NEGATIVE mg/dL
LEUKOCYTES UA: NEGATIVE
Nitrite: NEGATIVE
PROTEIN: NEGATIVE mg/dL
Specific Gravity, Urine: 1.008 (ref 1.005–1.030)
Squamous Epithelial / LPF: NONE SEEN (ref 0–5)
pH: 6 (ref 5.0–8.0)

## 2017-07-23 LAB — BRAIN NATRIURETIC PEPTIDE: B Natriuretic Peptide: 872 pg/mL — ABNORMAL HIGH (ref 0.0–100.0)

## 2017-07-23 LAB — CBC WITH DIFFERENTIAL/PLATELET
BASOS ABS: 0 10*3/uL (ref 0–0.1)
Basophils Relative: 1 %
EOS PCT: 1 %
Eosinophils Absolute: 0.1 10*3/uL (ref 0–0.7)
HEMATOCRIT: 27.1 % — AB (ref 35.0–47.0)
Hemoglobin: 8.7 g/dL — ABNORMAL LOW (ref 12.0–16.0)
LYMPHS ABS: 0.6 10*3/uL — AB (ref 1.0–3.6)
LYMPHS PCT: 8 %
MCH: 24.7 pg — AB (ref 26.0–34.0)
MCHC: 32.3 g/dL (ref 32.0–36.0)
MCV: 76.5 fL — AB (ref 80.0–100.0)
MONO ABS: 0.8 10*3/uL (ref 0.2–0.9)
Monocytes Relative: 10 %
NEUTROS ABS: 6.5 10*3/uL (ref 1.4–6.5)
Neutrophils Relative %: 80 %
PLATELETS: 360 10*3/uL (ref 150–440)
RBC: 3.54 MIL/uL — AB (ref 3.80–5.20)
RDW: 17.7 % — AB (ref 11.5–14.5)
WBC: 8 10*3/uL (ref 3.6–11.0)

## 2017-07-23 LAB — PROTIME-INR
INR: 1.18
Prothrombin Time: 14.9 seconds (ref 11.4–15.2)

## 2017-07-23 MED ORDER — NITROGLYCERIN 2 % TD OINT
1.0000 [in_us] | TOPICAL_OINTMENT | Freq: Once | TRANSDERMAL | Status: AC
  Administered 2017-07-23: 1 [in_us] via TOPICAL
  Filled 2017-07-23: qty 1

## 2017-07-23 MED ORDER — FUROSEMIDE 10 MG/ML IJ SOLN
20.0000 mg | Freq: Once | INTRAMUSCULAR | Status: AC
  Administered 2017-07-23: 20 mg via INTRAVENOUS
  Filled 2017-07-23: qty 4

## 2017-07-23 NOTE — Progress Notes (Signed)
Pt refused bipap , provider notified

## 2017-07-23 NOTE — ED Triage Notes (Signed)
Pt arrives via ems from home with reports of exertional SOB. Ems reports pt has had increasing SOB and swelling x 1 week. Pt home O2 91%. Ems placed pt on 4L O2 in route to hospital. On arrival pt onl;y able to say a few words at a time before becoming dyspneic.

## 2017-07-23 NOTE — ED Provider Notes (Signed)
[facility] Emergency Department Provider Note  ____________________________________________   First MD Initiated Contact with Patient 07/23/17 1936     (approximate)  I have reviewed the triage vital signs and the nursing notes.   HISTORY  Chief Complaint Shortness of Breath  Level 5 exemption history limited by the patient's clinical condition  HPI Mallory Rios is a 84 y.o. female comes to the emergency department via EMS with shortness of breath.  History is challenging to obtain as the patient can only speak in 1-2 word gasps she has noted increased shortness of breath for the past week or so and it became acutely worse today.  She does not use oxygen at baseline and when EMS arrived they noted she was saturating 91% on room air although came up with nasal cannula.  Patient does have some mild upper chest pain worse with cough improved when not coughing.  Past Medical History:  Diagnosis Date  . A-fib ([place])   . Cancer ([place])   . Hypertension   . MI (myocardial infarction) [facility])     Patient Active Problem List   Diagnosis Date Noted  . Acute respiratory failure ([place]) 07/23/2017  . CHF (congestive heart failure) ([place]) 06/01/2016    Past Surgical History:  Procedure Laterality Date  . ABDOMINAL HYSTERECTOMY    . CARDIAC SURGERY     stents x6    Prior to Admission medications   Medication Sig Start Date End Date Taking? Authorizing Provider  acetaminophen (TYLENOL) 325 MG tablet Take 650 mg by mouth every 6 (six) hours as needed.   Yes [provider]  Ascorbic Acid (VITAMIN C) 500 MG CAPS Take 500 mg by mouth daily.   Yes [provider]  aspirin 81 MG EC tablet Take 81 mg by mouth daily. 09/09/09  Yes [provider]  atorvastatin (LIPITOR) 40 MG tablet Take 40 mg by mouth daily at 6 PM.  02/11/16  Yes [provider]  famotidine (PEPCID) 20 MG tablet Take 1 tablet (20 mg total) by mouth at bedtime.  06/05/16  Yes Fritzi Mandes, MD  ferrous sulfate 325 (65 FE) MG tablet Take 325 mg by mouth daily with breakfast.   Yes [provider]  furosemide (LASIX) 20 MG tablet Take 1 tablet (20 mg total) by mouth daily. 03/11/16 07/23/21 Yes Nance Pear, MD  metoprolol tartrate (LOPRESSOR) 25 MG tablet Take 0.5 tablets (12.5 mg total) by mouth 2 (two) times daily. 06/05/16  Yes Fritzi Mandes, MD  nitroGLYCERIN (NITROSTAT) 0.4 MG SL tablet Place 0.4 mg under the tongue every 5 (five) minutes as needed for chest pain.   Yes [provider]  pantoprazole (PROTONIX) 40 MG tablet Take 40 mg by mouth daily.  02/11/16  Yes [provider]  vitamin E 400 UNIT capsule Take 400 Units by mouth daily.   Yes [provider]  chlorpheniramine-HYDROcodone (TUSSIONEX PENNKINETIC ER) 10-8 MG/5ML SUER Take 5 mLs by mouth at bedtime as needed for cough. Patient not taking: Reported on 03/11/2016 03/02/16   Rudene Re, MD  lisinopril (PRINIVIL,ZESTRIL) 2.5 MG tablet Take 1 tablet (2.5 mg total) by mouth daily. Patient not taking: Reported on 07/23/2017 06/05/16   Fritzi Mandes, MD  ondansetron (ZOFRAN ODT) 4 MG disintegrating tablet Take 1 tablet (4 mg total) by mouth every 8 (eight) hours as needed for nausea or vomiting. Patient not taking: Reported on 06/01/2016 03/02/16   Rudene Re, MD    Allergies Demerol [meperidine]; Other; and Propoxyphene  Family History  Problem Relation Age of Onset  . CAD Neg Hx   . Diabetes Neg Hx   . Hypertension Neg Hx     Social History Social History   Tobacco Use  . Smoking status: Never Smoker  . Smokeless tobacco: Never Used  Substance Use Topics  . Alcohol use: No  . Drug use: No    Review of Systems History limited by the patient's clinical condition  ____________________________________________   PHYSICAL EXAM:  VITAL SIGNS: ED Triage Vitals  Enc Vitals Group     BP      Pulse      Resp      Temp      Temp src       SpO2      Weight      Height      Head Circumference      Peak Flow      Pain Score      Pain Loc      Pain Edu?      Excl. in [place]?     Constitutional: Appears quite short of breath with elevated respiratory rate speaking in 1-2 word gasps Eyes: PERRL EOMI. Head: Atraumatic. Nose: No congestion/rhinnorhea. Mouth/Throat: No trismus Neck: No stridor.  Able lie completely flat but with significant JVD Cardiovascular: Normal rate, regular rhythm. Grossly normal heart sounds.  Good peripheral circulation. Respiratory: Increased respiratory effort with crackles bilateral bases Gastrointestinal: Soft nontender Musculoskeletal legs are equal in size Neurologic. No gross focal neurologic deficits are appreciated. Skin:  Skin is warm, dry and intact. No rash noted. Psychiatric: Anxious appearing    ____________________________________________   DIFFERENTIAL includes but not limited to  Acute pulmonary edema, pulmonary embolism, pneumothorax, pneumonia, sepsis ____________________________________________   LABS (all labs ordered are listed, but only abnormal results are displayed)  Labs Reviewed  COMPREHENSIVE METABOLIC PANEL - Abnormal; Notable for the following components:      Result Value   Glucose, Bld 107 (*)    BUN 24 (*)    Calcium 8.5 (*)    ALT 12 (*)    All other components within normal limits  CBC WITH DIFFERENTIAL/PLATELET - Abnormal; Notable for the following components:   RBC 3.54 (*)    Hemoglobin 8.7 (*)    HCT 27.1 (*)    MCV 76.5 (*)    MCH 24.7 (*)    RDW 17.7 (*)    Lymphs Abs 0.6 (*)    All other components within normal limits  BRAIN NATRIURETIC PEPTIDE - Abnormal; Notable for the following components:   B Natriuretic Peptide 872.0 (*)    All other components within normal limits  URINALYSIS, COMPLETE (UACMP) WITH MICROSCOPIC - Abnormal; Notable for the following components:   Color, Urine STRAW (*)    APPearance CLEAR (*)    All other  components within normal limits  BASIC METABOLIC PANEL - Abnormal; Notable for the following components:   Potassium 3.2 (*)    Chloride 100 (*)    Calcium 8.1 (*)    All other components within normal limits  CBC - Abnormal; Notable for the following components:   RBC 3.62 (*)    Hemoglobin 9.3 (*)    HCT 27.5 (*)    MCV 76.0 (*)    MCH 25.6 (*)    RDW 17.4 (*)    All other components within normal limits  IRON AND TIBC - Abnormal; Notable for the following components:   Iron 17 (*)  Saturation Ratios 4 (*)    All other components within normal limits  PROTIME-INR  TROPONIN I  TROPONIN I  TROPONIN I  TROPONIN I  FERRITIN  FOLATE  VITAMIN B12  GLUCOSE, CAPILLARY  GLUCOSE, CAPILLARY  GLUCOSE, CAPILLARY  BASIC METABOLIC PANEL    Lab work reviewed by me with elevated BNP consistent with acute pulmonary edema __________________________________________  EKG  ED ECG REPORT I, Darel Hong, the attending physician, personally viewed and interpreted this ECG.  Date: 07/23/2017 EKG Time:  Rate: 88 Rhythm: Atrial fibrillation QRS Axis: Rightward axis Intervals: normal ST/T Wave abnormalities: normal Narrative Interpretation: no evidence of acute ischemia  ____________________________________________  RADIOLOGY  Chest x-ray reviewed by me consistent with acute pulmonary edema ____________________________________________   PROCEDURES  Procedure(s) performed: no  Procedures  Critical Care performed: no  Observation: no ____________________________________________   INITIAL IMPRESSION / ASSESSMENT AND PLAN / ED COURSE  Pertinent labs & imaging results that were available during my care of the patient were reviewed by me and considered in my medical decision making (see chart for details).  The patient arrives with elevated respiratory effort, crackles at bilateral bases, hypoxic on room air.  Differential is broad but includes pulmonary edema versus  pneumothorax versus pulmonary embolism.  She has a rightward axis on her EKG that is old.  Broad lab work and chest x-ray are pending.  The patient's chest x-ray and blood work are consistent with acute pulmonary edema.  Given a first dose of IV Lasix now.  I initially recommended BiPAP however the patient declined.  At this point given her fluid overload and persistent hypoxemia she requires inpatient admission for continued diuresis.  The patient verbalizes understanding agree with plan.      ____________________________________________   FINAL CLINICAL IMPRESSION(S) / ED DIAGNOSES  Final diagnoses:  Acute pulmonary edema ([place])      NEW MEDICATIONS STARTED DURING THIS VISIT:  Current Discharge Medication List       Note:  This document was prepared using Dragon voice recognition software and may include unintentional dictation errors.     Darel Hong, MD 07/25/17 1436

## 2017-07-23 NOTE — H&P (Signed)
[facility] [place] NAME: Mallory Rios    MR#:  [id number]  DATE OF BIRTH:  17-Oct-1927  DATE OF ADMISSION:  07/23/2017  PRIMARY CARE PHYSICIAN: Valera Castle, MD   REQUESTING/REFERRING PHYSICIAN:   CHIEF COMPLAINT:   Chief Complaint  Patient presents with  . Shortness of Breath    HISTORY OF PRESENT ILLNESS: Mallory Rios  is a 84 y.o. 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 Patient was brought to emergency room via EMS for respiratory distress.  Patient complains of shortness of breath and lower extremity swelling going on for the past 3 days, gradually getting worse.  Her symptoms are worse with exertion.  She denies any fever or chills, no chest pain or cough, no N/V/D, no bleeding.  She states she is compliant with medications. At the arrival to the emergency room, patient's oxygen saturation was 91% on room air.  Her RR was at 28.  Patient was offered BiPAP treatment but she refused.  She was placed on 4 L O2, per [place]. Blood test done in the emergency room, revealed low hemoglobin level at 8.7.  Most recent hemoglobin level 1 year ago was, at 13.2.  Troponin level is 0.03, which is the baseline in this patient.  BNP is elevated at 872.  EKG, reviewed by myself shows atrial fibrillation with a heart rate of 82.  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  These changes are seen on prior EKGs.  No acute changes.  Chest x-ray, reviewed by myself, shows interstitial edema and small right pleural effusion. Patient is admitted for further evaluation and treatment.  PAST MEDICAL HISTORY:   Past Medical History:  Diagnosis Date  . A-fib ([place])   . Cancer ([place])   . Hypertension   . MI (myocardial infarction)  ([place])     PAST SURGICAL HISTORY:  Past Surgical History:  Procedure Laterality Date  . ABDOMINAL HYSTERECTOMY    . CARDIAC SURGERY     stents x6    SOCIAL HISTORY:  Social History   Tobacco Use  . Smoking status: Never Smoker  . Smokeless tobacco: Never Used  Substance Use Topics  . Alcohol use: No    FAMILY HISTORY:  Family History  Problem Relation Age of Onset  . CAD Neg Hx   . Diabetes Neg Hx   . Hypertension Neg Hx     DRUG ALLERGIES:  Allergies  Allergen Reactions  . Demerol [Meperidine]   . Other Other (See Comments)  . Propoxyphene     REVIEW OF SYSTEMS:   CONSTITUTIONAL: No fever, but patient complains of fatigue and generalized weakness.  EYES: No blurred or double vision.  EARS, NOSE, AND THROAT: No tinnitus or ear pain.  RESPIRATORY: No cough, but patient complains of shortness of breath.no wheezing or hemoptysis.  CARDIOVASCULAR: No chest pain, orthopnea, edema.  GASTROINTESTINAL: No nausea, vomiting, diarrhea or abdominal pain.  GENITOURINARY: No dysuria, hematuria.  ENDOCRINE: No polyuria, nocturia. HEMATOLOGY: No bleeding. SKIN: No rash or lesion. MUSCULOSKELETAL: No joint pain.   NEUROLOGIC: No focal weakness.  PSYCHIATRY: No anxiety or depression.   MEDICATIONS AT HOME:  Prior to Admission medications   Medication Sig Start Date End Date Taking? Authorizing Provider  acetaminophen (TYLENOL) 325 MG tablet Take 650 mg by mouth every 6 (  six) hours as needed.   Yes [provider]  Ascorbic Acid (VITAMIN C) 500 MG CAPS Take 500 mg by mouth daily.   Yes [provider]  aspirin 81 MG EC tablet Take 81 mg by mouth daily. 09/09/09  Yes [provider]  atorvastatin (LIPITOR) 40 MG tablet Take 40 mg by mouth daily at 6 PM.  02/11/16  Yes [provider]  famotidine (PEPCID) 20 MG tablet Take 1 tablet (20 mg total) by mouth at bedtime. 06/05/16  Yes Fritzi Mandes, MD  ferrous sulfate 325 (65 FE) MG tablet Take 325  mg by mouth daily with breakfast.   Yes [provider]  furosemide (LASIX) 20 MG tablet Take 1 tablet (20 mg total) by mouth daily. 03/11/16 07/23/21 Yes Nance Pear, MD  metoprolol tartrate (LOPRESSOR) 25 MG tablet Take 0.5 tablets (12.5 mg total) by mouth 2 (two) times daily. 06/05/16  Yes Fritzi Mandes, MD  nitroGLYCERIN (NITROSTAT) 0.4 MG SL tablet Place 0.4 mg under the tongue every 5 (five) minutes as needed for chest pain.   Yes [provider]  pantoprazole (PROTONIX) 40 MG tablet Take 40 mg by mouth daily.  02/11/16  Yes [provider]  vitamin E 400 UNIT capsule Take 400 Units by mouth daily.   Yes [provider]  chlorpheniramine-HYDROcodone (TUSSIONEX PENNKINETIC ER) 10-8 MG/5ML SUER Take 5 mLs by mouth at bedtime as needed for cough. Patient not taking: Reported on 03/11/2016 03/02/16   Rudene Re, MD  lisinopril (PRINIVIL,ZESTRIL) 2.5 MG tablet Take 1 tablet (2.5 mg total) by mouth daily. Patient not taking: Reported on 07/23/2017 06/05/16   Fritzi Mandes, MD  ondansetron (ZOFRAN ODT) 4 MG disintegrating tablet Take 1 tablet (4 mg total) by mouth every 8 (eight) hours as needed for nausea or vomiting. Patient not taking: Reported on 06/01/2016 03/02/16   Rudene Re, MD      PHYSICAL EXAMINATION:   VITAL SIGNS: Blood pressure (!) 155/63, pulse 73, temperature 98.4 F (36.9 C), temperature source Oral, resp. rate (!) 26, height 4\' 8"  (1.422 m), weight 49.4 kg (109 lb), SpO2 100 %.  GENERAL:  84 y.o.-year-old patient lying in the bed with mild to moderate respiratory distress.  EYES: Pupils equal, round, reactive to light and accommodation. No scleral icterus.  HEENT: Head atraumatic, normocephalic. Oropharynx and nasopharynx clear.  NECK:  Supple, no jugular venous distention. No thyroid enlargement, no tenderness.  LUNGS: Reduced breath sounds bilaterally, no wheezing.  Bibasilar crackles are noted.  There is mild use of accessory  muscles of respiration.  CARDIOVASCULAR: S1, S2 normal. No S3/S4.  ABDOMEN: Soft, nontender, nondistended. Bowel sounds present. No organomegaly or mass.  EXTREMITIES: Bilateral 2+ pitting edema, up to the knees noted.  NEUROLOGIC: No focal weakness.  PSYCHIATRIC: The patient is alert and oriented x 3.  SKIN: No obvious rash, lesion, or ulcer.   LABORATORY PANEL:   CBC Recent Labs  Lab 07/23/17 2000  WBC 8.0  HGB 8.7*  HCT 27.1*  PLT 360  MCV 76.5*  MCH 24.7*  MCHC 32.3  RDW 17.7*  LYMPHSABS 0.6*  MONOABS 0.8  EOSABS 0.1  BASOSABS 0.0   ------------------------------------------------------------------------------------------------------------------  Chemistries  Recent Labs  Lab 07/23/17 2000  NA 139  K 3.6  CL 103  CO2 27  GLUCOSE 107*  BUN 24*  CREATININE 0.54  CALCIUM 8.5*  AST 21  ALT 12*  ALKPHOS 74  BILITOT 0.6   ------------------------------------------------------------------------------------------------------------------ estimated creatinine clearance is 32.5 mL/min (by C-G  formula based on SCr of 0.54 mg/dL). ------------------------------------------------------------------------------------------------------------------ No results for input(s): TSH, T4TOTAL, T3FREE, THYROIDAB in the last 72 hours.  Invalid input(s): FREET3   Coagulation profile Recent Labs  Lab 07/23/17 2000  INR 1.18   ------------------------------------------------------------------------------------------------------------------- No results for input(s): DDIMER in the last 72 hours. -------------------------------------------------------------------------------------------------------------------  Cardiac Enzymes No results for input(s): CKMB, TROPONINI, MYOGLOBIN in the last 168 hours.  Invalid input(s): CK ------------------------------------------------------------------------------------------------------------------ Invalid input(s):  POCBNP  ---------------------------------------------------------------------------------------------------------------  Urinalysis    Component Value Date/Time   COLORURINE STRAW (A) 07/23/2017 2226   APPEARANCEUR CLEAR (A) 07/23/2017 2226   LABSPEC 1.008 07/23/2017 2226   PHURINE 6.0 07/23/2017 2226   GLUCOSEU NEGATIVE 07/23/2017 2226   HGBUR NEGATIVE 07/23/2017 2226   BILIRUBINUR NEGATIVE 07/23/2017 2226   KETONESUR NEGATIVE 07/23/2017 2226   PROTEINUR NEGATIVE 07/23/2017 2226   NITRITE NEGATIVE 07/23/2017 2226   LEUKOCYTESUR NEGATIVE 07/23/2017 2226     RADIOLOGY: Dg Chest Port 1 View  Result Date: 07/23/2017 CLINICAL DATA:  Exertional dyspnea EXAM: PORTABLE CHEST 1 VIEW COMPARISON:  06/01/2016 FINDINGS: Stable cardiomegaly with moderate aortic atherosclerosis at the arch. No aneurysm. Double density over the cardiac silhouette consistent with a moderate to large hiatal hernia. Moderate right effusion with blunting of the costophrenic angles. Mild diffuse interstitial edema is seen. No acute osseous abnormality. IMPRESSION: Cardiomegaly with mild interstitial edema and small right pleural effusion. Aortic atherosclerosis without aneurysm. Hiatal hernia. Electronically Signed   By: Ashley Royalty M.D.   On: 07/23/2017 20:26    EKG: Orders placed or performed during the hospital encounter of 07/23/17  . ED EKG  . ED EKG    IMPRESSION AND PLAN:  1. Acute respiratory failure with hypoxia, likely multifactorial secondary to acute pulmonary edema and anemia.  We will start oxygen therapy and Lasix IV.  Continue metoprolol and lisinopril.  Will check iron studies, ferritin, vitamin B12 and folate levels.  Will check 2D echo continue to monitor on telemetry; follow troponin levels.  Cardiology is consulted for further evaluation and treatment. 2.  Acute pulmonary edema, see management as above under #1. 3.  Chronic atrial fibrillation, rate controlled, continue metoprolol.  Patient has  not on anticoagulation due to high risk for falls and bleeding.  Continue to monitor on telemetry. 4.  Microcytic anemia.  Hemoglobin level is currently low at 8.7, a significant drop from 13.2 last year.  No active bleeding.Will check iron studies, ferritin, vitamin B12 and folate levels.  She might require further work-up to rule out occult GI bleed.  Continue PPI. 5.  Mitral valve insufficiency, continue management per cardiology.  All the records are reviewed and case discussed with ED provider. Management plans discussed with the patient and she is in agreement.  CODE STATUS: FULL Code Status History    Date Active Date Inactive Code Status Order ID Comments User Context   06/01/2016 0703 06/05/2016 2003 Full Code [id number]  Saundra Shelling, MD ED       TOTAL TIME TAKING CARE OF THIS PATIENT: 45 minutes.    Amelia Jo M.D on 07/23/2017 at 11:34 PM  Between 7am to 6pm - Pager - [phone]  After 6pm go to [email or web address] - password [email or web address] [facility]  Office  [phone]  CC: Primary care physician; Valera Castle, MD

## 2017-07-24 ENCOUNTER — Encounter: Payer: Self-pay | Admitting: Emergency Medicine

## 2017-07-24 ENCOUNTER — Inpatient Hospital Stay
Admit: 2017-07-24 | Discharge: 2017-07-24 | Disposition: A | Discharge status: Home / Self Care | Payer: Medicare Other | Attending: Internal Medicine | Admitting: Internal Medicine

## 2017-07-24 LAB — CBC
HEMATOCRIT: 27.5 % — AB (ref 35.0–47.0)
Hemoglobin: 9.3 g/dL — ABNORMAL LOW (ref 12.0–16.0)
MCH: 25.6 pg — AB (ref 26.0–34.0)
MCHC: 33.7 g/dL (ref 32.0–36.0)
MCV: 76 fL — AB (ref 80.0–100.0)
PLATELETS: 354 10*3/uL (ref 150–440)
RBC: 3.62 MIL/uL — AB (ref 3.80–5.20)
RDW: 17.4 % — ABNORMAL HIGH (ref 11.5–14.5)
WBC: 7.5 10*3/uL (ref 3.6–11.0)

## 2017-07-24 LAB — FOLATE: Folate: 28 ng/mL (ref >5.9)

## 2017-07-24 LAB — IRON AND TIBC
Iron: 17 ug/dL — ABNORMAL LOW (ref 28–170)
Saturation Ratios: 4 % — ABNORMAL LOW (ref 10.4–31.8)
TIBC: 450 ug/dL (ref 250–450)
UIBC: 433 ug/dL

## 2017-07-24 LAB — BASIC METABOLIC PANEL
Anion gap: 8 (ref 5–15)
BUN: 18 mg/dL (ref 6–20)
CHLORIDE: 100 mmol/L — AB (ref 101–111)
CO2: 31 mmol/L (ref 22–32)
CREATININE: 0.67 mg/dL (ref 0.44–1.00)
Calcium: 8.1 mg/dL — ABNORMAL LOW (ref 8.9–10.3)
GFR calc Af Amer: >60 mL/min (ref >60)
GFR calc non Af Amer: >60 mL/min (ref >60)
Glucose, Bld: 90 mg/dL (ref 65–99)
POTASSIUM: 3.2 mmol/L — AB (ref 3.5–5.1)
SODIUM: 139 mmol/L (ref 135–145)

## 2017-07-24 LAB — TROPONIN I
Troponin I: <0.03 ng/mL (ref <0.03)
Troponin I: <0.03 ng/mL (ref <0.03)
Troponin I: <0.03 ng/mL (ref <0.03)

## 2017-07-24 LAB — FERRITIN: Ferritin: 11 ng/mL (ref 11–307)

## 2017-07-24 LAB — ECHOCARDIOGRAM COMPLETE
Height: 56 in
Weight: 1865.6 oz

## 2017-07-24 LAB — GLUCOSE, CAPILLARY
Glucose-Capillary: 84 mg/dL (ref 65–99)
Glucose-Capillary: 79 mg/dL (ref 65–99)

## 2017-07-24 LAB — VITAMIN B12: Vitamin B-12: 335 pg/mL (ref 180–914)

## 2017-07-24 MED ORDER — PANTOPRAZOLE SODIUM 40 MG PO TBEC
40.0000 mg | DELAYED_RELEASE_TABLET | Freq: Every day | ORAL | Status: DC
  Administered 2017-07-24 – 2017-07-25 (×2): 40 mg via ORAL
  Filled 2017-07-24 (×2): qty 1

## 2017-07-24 MED ORDER — VITAMIN E 180 MG (400 UNIT) PO CAPS
400.0000 [IU] | ORAL_CAPSULE | Freq: Every day | ORAL | Status: DC
  Administered 2017-07-25 – 2017-07-28 (×4): 400 [IU] via ORAL
  Filled 2017-07-24 (×5): qty 1

## 2017-07-24 MED ORDER — BISACODYL 5 MG PO TBEC
5.0000 mg | DELAYED_RELEASE_TABLET | Freq: Every day | ORAL | Status: DC | PRN
  Administered 2017-07-24: 5 mg via ORAL
  Filled 2017-07-24: qty 1

## 2017-07-24 MED ORDER — ONDANSETRON HCL 4 MG PO TABS: 4.0000 mg | ORAL_TABLET | Freq: Four times a day (QID) | ORAL | Status: DC | PRN

## 2017-07-24 MED ORDER — ACETAMINOPHEN 650 MG RE SUPP: 650.0000 mg | Freq: Four times a day (QID) | RECTAL | Status: DC | PRN

## 2017-07-24 MED ORDER — HYDROCODONE-ACETAMINOPHEN 5-325 MG PO TABS: 1.0000 | ORAL_TABLET | ORAL | Status: DC | PRN

## 2017-07-24 MED ORDER — METOPROLOL TARTRATE 25 MG PO TABS
12.5000 mg | ORAL_TABLET | Freq: Two times a day (BID) | ORAL | Status: DC
  Administered 2017-07-24 – 2017-07-28 (×9): 12.5 mg via ORAL
  Filled 2017-07-24 (×9): qty 1

## 2017-07-24 MED ORDER — ATORVASTATIN CALCIUM 20 MG PO TABS
40.0000 mg | ORAL_TABLET | Freq: Every day | ORAL | Status: DC
  Administered 2017-07-24 – 2017-07-27 (×4): 40 mg via ORAL
  Filled 2017-07-24 (×5): qty 2

## 2017-07-24 MED ORDER — TRAZODONE HCL 50 MG PO TABS: 25.0000 mg | ORAL_TABLET | Freq: Every evening | ORAL | Status: DC | PRN

## 2017-07-24 MED ORDER — DOCUSATE SODIUM 100 MG PO CAPS
100.0000 mg | ORAL_CAPSULE | Freq: Two times a day (BID) | ORAL | Status: DC
  Administered 2017-07-24 – 2017-07-28 (×9): 100 mg via ORAL
  Filled 2017-07-24 (×10): qty 1

## 2017-07-24 MED ORDER — ASPIRIN EC 81 MG PO TBEC
81.0000 mg | DELAYED_RELEASE_TABLET | Freq: Every day | ORAL | Status: DC
  Administered 2017-07-24 – 2017-07-28 (×5): 81 mg via ORAL
  Filled 2017-07-24 (×5): qty 1

## 2017-07-24 MED ORDER — NITROGLYCERIN 0.4 MG SL SUBL: 0.4000 mg | SUBLINGUAL_TABLET | SUBLINGUAL | Status: DC | PRN

## 2017-07-24 MED ORDER — FERROUS SULFATE 325 (65 FE) MG PO TABS
325.0000 mg | ORAL_TABLET | Freq: Every day | ORAL | Status: DC
  Administered 2017-07-24 – 2017-07-28 (×5): 325 mg via ORAL
  Filled 2017-07-24 (×5): qty 1

## 2017-07-24 MED ORDER — VITAMIN C 500 MG PO TABS
500.0000 mg | ORAL_TABLET | Freq: Every day | ORAL | Status: DC
  Administered 2017-07-24 – 2017-07-28 (×5): 500 mg via ORAL
  Filled 2017-07-24 (×5): qty 1

## 2017-07-24 MED ORDER — HEPARIN SODIUM (PORCINE) 5000 UNIT/ML IJ SOLN
5000.0000 [IU] | Freq: Three times a day (TID) | INTRAMUSCULAR | Status: DC
  Administered 2017-07-24 (×3): 5000 [IU] via SUBCUTANEOUS
  Filled 2017-07-24 (×4): qty 1

## 2017-07-24 MED ORDER — FUROSEMIDE 10 MG/ML IJ SOLN
20.0000 mg | Freq: Two times a day (BID) | INTRAMUSCULAR | Status: DC
  Administered 2017-07-24 (×2): 20 mg via INTRAVENOUS
  Filled 2017-07-24 (×3): qty 2

## 2017-07-24 MED ORDER — ONDANSETRON HCL 4 MG/2ML IJ SOLN: 4.0000 mg | Freq: Four times a day (QID) | INTRAMUSCULAR | Status: DC | PRN

## 2017-07-24 MED ORDER — ACETAMINOPHEN 325 MG PO TABS
650.0000 mg | ORAL_TABLET | Freq: Four times a day (QID) | ORAL | Status: DC | PRN
  Administered 2017-07-24 – 2017-07-27 (×6): 650 mg via ORAL
  Filled 2017-07-24 (×6): qty 2

## 2017-07-24 NOTE — Plan of Care (Signed)
  Problem: Activity: Goal: Risk for activity intolerance will decrease Outcome: Progressing   Problem: Safety: Goal: Ability to remain free from injury will improve Outcome: Progressing   Problem: Clinical Measurements: Goal: Respiratory complications will improve Outcome: Not Progressing  Pt becomes very dyspneic with bed to chair transfer on room air.

## 2017-07-24 NOTE — ED Notes (Signed)
Delay calling report d/t CODE STROKE in room 16

## 2017-07-24 NOTE — Progress Notes (Signed)
Patient is admitted to room 260 with the diagnosis of acute respiratory failure. Alert and oriented x 4 but supper HOH. Patient denies any acute pain. Full assessment to epic completed. Will continue to monitor.

## 2017-07-24 NOTE — ED Notes (Signed)
Floor called pt otw att

## 2017-07-24 NOTE — Progress Notes (Signed)
[facility] [place] NAME: Mallory Rios    MR#:  [id number]  DATE OF BIRTH:  1927/11/05  SUBJECTIVE: Waited for shortness of breath, acute on chronic systolic heart failure, and IV Lasix, oxygen.  She says she is little better than yesterday.  CHIEF COMPLAINT:   Chief Complaint  Patient presents with  . Shortness of Breath    REVIEW OF SYSTEMS:    Review of Systems  Constitutional: Negative for chills and fever.  HENT: Negative for hearing loss.   Eyes: Negative for blurred vision, double vision and photophobia.  Respiratory: Positive for cough and shortness of breath. Negative for hemoptysis.   Cardiovascular: Positive for leg swelling. Negative for palpitations and orthopnea.  Gastrointestinal: Negative for abdominal pain, diarrhea and vomiting.  Genitourinary: Negative for dysuria and urgency.  Musculoskeletal: Negative for myalgias and neck pain.  Skin: Negative for rash.  Neurological: Negative for dizziness, focal weakness, seizures, weakness and headaches.  Psychiatric/Behavioral: Negative for memory loss. The patient does not have insomnia.     Nutrition:  Tolerating Diet: Tolerating PT:      DRUG ALLERGIES:   Allergies  Allergen Reactions  . Demerol [Meperidine]   . Other Other (See Comments)  . Propoxyphene     VITALS:  Blood pressure (!) 144/72, pulse 74, temperature 98 F (36.7 C), temperature source Oral, resp. rate 18, height 4\' 8"  (1.422 m), weight 52.9 kg (116 lb 9.6 oz), SpO2 100 %.  PHYSICAL EXAMINATION:   Physical Exam  GENERAL:  84 y.o.-year-old patient lying in the bed with no acute distress.  EYES: Pupils equal, round, reactive to light and accommodation. No scleral icterus. Extraocular muscles intact.  HEENT: Head atraumatic, normocephalic. Oropharynx and nasopharynx clear.  NECK:  Supple, no jugular venous distention. No thyroid enlargement, no tenderness.  LUNGS: Normal breath  sounds bilaterally, no wheezing, rales,rhonchi or crepitation. No use of accessory muscles of respiration.  CARDIOVASCULAR: S1, S2 normal. No murmurs, rubs, or gallops.  ABDOMEN: Soft, nontender, nondistended. Bowel sounds present. No organomegaly or mass.  EXTREMITIES: No pedal edema, cyanosis, or clubbing.  NEUROLOGIC: Cranial nerves II through XII are intact. Muscle strength 5/5 in all extremities. Sensation intact. Gait not checked.  PSYCHIATRIC: The patient is alert and oriented x 3.  SKIN: No obvious rash, lesion, or ulcer.    LABORATORY PANEL:   CBC Recent Labs  Lab 07/24/17 0658  WBC 7.5  HGB 9.3*  HCT 27.5*  PLT 354   ------------------------------------------------------------------------------------------------------------------  Chemistries  Recent Labs  Lab 07/23/17 2000 07/24/17 0658  NA 139 139  K 3.6 3.2*  CL 103 100*  CO2 27 31  GLUCOSE 107* 90  BUN 24* 18  CREATININE 0.54 0.67  CALCIUM 8.5* 8.1*  AST 21  --   ALT 12*  --   ALKPHOS 74  --   BILITOT 0.6  --    ------------------------------------------------------------------------------------------------------------------  Cardiac Enzymes Recent Labs  Lab 07/24/17 0658  TROPONINI <0.03   ------------------------------------------------------------------------------------------------------------------  RADIOLOGY:  Dg Chest Port 1 View  Result Date: 07/23/2017 CLINICAL DATA:  Exertional dyspnea EXAM: PORTABLE CHEST 1 VIEW COMPARISON:  06/01/2016 FINDINGS: Stable cardiomegaly with moderate aortic atherosclerosis at the arch. No aneurysm. Double density over the cardiac silhouette consistent with a moderate to large hiatal hernia. Moderate right effusion with blunting of the costophrenic angles. Mild diffuse interstitial edema is seen. No acute osseous abnormality. IMPRESSION: Cardiomegaly with mild interstitial edema and small right pleural effusion. Aortic atherosclerosis without  aneurysm. Hiatal  hernia. Electronically Signed   By: Ashley Royalty M.D.   On: 07/23/2017 20:26     ASSESSMENT AND PLAN:   Active Problems:   Acute respiratory failure (HCC)  Acute respiratory failure with hypoxia secondary to acute on chronic diastolic heart failure: Improving with the diuresis.  O2 saturation 91% on room air when she came on 3 L 100% oxygen.  Wean off oxygen.  Continue low-salt diet, IV diuretics for 1 more day, likely discharge home tomorrow when we can wean off oxygen.  #2 acute on chronic diastolic heart failure: Continue IV Lasix. 3./Chronic A. fib: Rate controlled. 4.  Change to inpatient status.  Hypokalemia: Replace the potassium.  All the records are reviewed and case discussed with Care Management/Social Workerr. Management plans discussed with the patient, family and they are in agreement.  CODE STATUS: full  TOTAL TIME TAKING CARE OF THIS PATIENT: 1-2 minutes.   POSSIBLE D/C IN 1-2DAYS, DEPENDING ON CLINICAL CONDITION.   Epifanio Lesches M.D on 07/24/2017 at 12:48 PM  Between 7am to 6pm - Pager - [phone]  After 6pm go to [email or web address] - password [email or web address] [facility]  Office  [phone]  CC: Primary care physician; Valera Castle, MD

## 2017-07-24 NOTE — Consult Note (Signed)
[facility]  CARDIOLOGY CONSULT NOTE  Patient ID: Mallory Rios MRN: [id number] DOB/AGE: Nov 15, 1927 84 y.o.  Admit date: 07/23/2017 Referring Physician Reubin Milan Primary Physician [facility] Primary Cardiologist Nehemiah Massed Reason for Consultation congestive heart failure  HPI: 84 year old female referred for evaluation of congestive heart failure.  Patient presented to [facility] ED via EMS for respiratory distress.  She reports a 2-day history of worsening peripheral edema and shortness of breath.  In the emergency room, the patient was noted to be hard of breath, and saturation 91% on room air, on 4 L of oxygen, with intravenous furosemide, diuresis, and overall clinical improvement.  Was admitted to telemetry where she is had an uncomplicated hospital course.  Labs notable for negative troponin less than 0.032.  Labs also noted for worsening anemia with hemoglobin hematocrit 8.7 and 27.1, respectively.  Review of systems complete and found to be negative unless listed above     Past Medical History:  Diagnosis Date  . A-fib ([place])   . Cancer ([place])   . Hypertension   . MI (myocardial infarction) [facility])     Past Surgical History:  Procedure Laterality Date  . ABDOMINAL HYSTERECTOMY    . CARDIAC SURGERY     stents x6    Medications Prior to Admission  Medication Sig Dispense Refill Last Dose  . acetaminophen (TYLENOL) 325 MG tablet Take 650 mg by mouth every 6 (six) hours as needed.   07/23/2017 at Unknown time  . Ascorbic Acid (VITAMIN C) 500 MG CAPS Take 500 mg by mouth daily.   07/23/2017 at Unknown time  . aspirin 81 MG EC tablet Take 81 mg by mouth daily.   07/23/2017 at Unknown time  . atorvastatin (LIPITOR) 40 MG tablet Take 40 mg by mouth daily at 6 PM.    07/23/2017 at Unknown time  . famotidine (PEPCID) 20 MG tablet Take 1 tablet (20 mg total) by mouth at bedtime. 30 tablet 0 07/22/2017 at Unknown time  . ferrous sulfate 325 (65 FE) MG tablet Take 325 mg by mouth daily with  breakfast.   07/23/2017 at Unknown time  . furosemide (LASIX) 20 MG tablet Take 1 tablet (20 mg total) by mouth daily. 3 tablet 0 07/23/2017 at Unknown time  . metoprolol tartrate (LOPRESSOR) 25 MG tablet Take 0.5 tablets (12.5 mg total) by mouth 2 (two) times daily. 60 tablet 0 07/23/2017 at Unknown time  . nitroGLYCERIN (NITROSTAT) 0.4 MG SL tablet Place 0.4 mg under the tongue every 5 (five) minutes as needed for chest pain.   prn  . pantoprazole (PROTONIX) 40 MG tablet Take 40 mg by mouth daily.    07/23/2017 at Unknown time  . vitamin E 400 UNIT capsule Take 400 Units by mouth daily.   07/23/2017 at Unknown time  . chlorpheniramine-HYDROcodone (TUSSIONEX PENNKINETIC ER) 10-8 MG/5ML SUER Take 5 mLs by mouth at bedtime as needed for cough. (Patient not taking: Reported on 03/11/2016) 50 mL 0 Completed Course at Unknown time  . lisinopril (PRINIVIL,ZESTRIL) 2.5 MG tablet Take 1 tablet (2.5 mg total) by mouth daily. (Patient not taking: Reported on 07/23/2017) 30 tablet 0 Not Taking at Unknown time  . ondansetron (ZOFRAN ODT) 4 MG disintegrating tablet Take 1 tablet (4 mg total) by mouth every 8 (eight) hours as needed for nausea or vomiting. (Patient not taking: Reported on 06/01/2016) 20 tablet 0 Not Taking at Unknown time   Social History   Socioeconomic History  . Marital status: Married    Spouse name: Not  on file  . Number of children: Not on file  . Years of education: Not on file  . Highest education level: Not on file  Occupational History  . Occupation: retired  Scientific laboratory technician  . Financial resource strain: Not on file  . Food insecurity:    Worry: Not on file    Inability: Not on file  . Transportation needs:    Medical: Not on file    Non-medical: Not on file  Tobacco Use  . Smoking status: Never Smoker  . Smokeless tobacco: Never Used  Substance and Sexual Activity  . Alcohol use: No  . Drug use: No  . Sexual activity: Not on file  Lifestyle  . Physical activity:    Days per  week: Not on file    Minutes per session: Not on file  . Stress: Not on file  Relationships  . Social connections:    Talks on phone: Not on file    Gets together: Not on file    Attends religious service: Not on file    Active member of club or organization: Not on file    Attends meetings of clubs or organizations: Not on file    Relationship status: Not on file  . Intimate partner violence:    Fear of current or ex partner: Not on file    Emotionally abused: Not on file    Physically abused: Not on file    Forced sexual activity: Not on file  Other Topics Concern  . Not on file  Social History Narrative  . Not on file    Family History  Problem Relation Age of Onset  . CAD Neg Hx   . Diabetes Neg Hx   . Hypertension Neg Hx       Review of systems complete and found to be negative unless listed above      PHYSICAL EXAM  General: Well developed, well nourished, in no acute distress HEENT:  Normocephalic and atramatic Neck:  No JVD.  Lungs: Clear bilaterally to auscultation and percussion. Heart: HRRR . Normal S1 and S2 without gallops or murmurs.  Abdomen: Bowel sounds are positive, abdomen soft and non-tender  Msk:  Back normal, normal gait. Normal strength and tone for age. Extremities: No clubbing, cyanosis or edema.   Neuro: Alert and oriented X 3. Psych:  Good affect, responds appropriately  Labs:   Lab Results  Component Value Date   WBC 7.5 07/24/2017   HGB 9.3 (L) 07/24/2017   HCT 27.5 (L) 07/24/2017   MCV 76.0 (L) 07/24/2017   PLT 354 07/24/2017    Recent Labs  Lab 07/23/17 2000 07/24/17 0658  NA 139 139  K 3.6 3.2*  CL 103 100*  CO2 27 31  BUN 24* 18  CREATININE 0.54 0.67  CALCIUM 8.5* 8.1*  PROT 6.8  --   BILITOT 0.6  --   ALKPHOS 74  --   ALT 12*  --   AST 21  --   GLUCOSE 107* 90   Lab Results  Component Value Date   TROPONINI <0.03 07/24/2017   No results found for: CHOL No results found for: HDL No results found for:  LDLCALC No results found for: TRIG No results found for: CHOLHDL No results found for: LDLDIRECT    Radiology: Dg Chest Port 1 View  Result Date: 07/23/2017 CLINICAL DATA:  Exertional dyspnea EXAM: PORTABLE CHEST 1 VIEW COMPARISON:  06/01/2016 FINDINGS: Stable cardiomegaly with moderate aortic atherosclerosis at the arch. No  aneurysm. Double density over the cardiac silhouette consistent with a moderate to large hiatal hernia. Moderate right effusion with blunting of the costophrenic angles. Mild diffuse interstitial edema is seen. No acute osseous abnormality. IMPRESSION: Cardiomegaly with mild interstitial edema and small right pleural effusion. Aortic atherosclerosis without aneurysm. Hiatal hernia. Electronically Signed   By: Ashley Royalty M.D.   On: 07/23/2017 20:26    EKG: Atrial fibrillation at a rate of 88 bpm with right bundle branch block  ASSESSMENT AND PLAN:   1.  Acute on chronic diastolic congestive heart failure, improved after initial diuresis 2.  Chronic atrial fibrillation, rate controlled, on aspirin for stroke prevention, followed by Dr. Nehemiah Massed 3.  Anemia, no history of active GI bleeding 4.  Severe mitral regurgitation by previous echo  Recommendations  1.  Agree with current therapy 2.  Continue diuresis 3.  Carefully monitor renal status 4.  Continue aspirin for stroke prevention 5.  Review 2D echocardiogram  Signed: Isaias Cowman MD,PhD, [facility] 07/24/2017, 9:12 AM

## 2017-07-25 LAB — BASIC METABOLIC PANEL
Anion gap: 9 (ref 5–15)
BUN: 22 mg/dL — AB (ref 6–20)
CO2: 33 mmol/L — ABNORMAL HIGH (ref 22–32)
CREATININE: 0.68 mg/dL (ref 0.44–1.00)
Calcium: 8.7 mg/dL — ABNORMAL LOW (ref 8.9–10.3)
Chloride: 96 mmol/L — ABNORMAL LOW (ref 101–111)
GFR calc Af Amer: >60 mL/min (ref >60)
GLUCOSE: 115 mg/dL — AB (ref 65–99)
Potassium: 3.3 mmol/L — ABNORMAL LOW (ref 3.5–5.1)
SODIUM: 138 mmol/L (ref 135–145)

## 2017-07-25 LAB — GLUCOSE, CAPILLARY: Glucose-Capillary: 94 mg/dL (ref 65–99)

## 2017-07-25 MED ORDER — SODIUM CHLORIDE 0.9% FLUSH
3.0000 mL | Freq: Two times a day (BID) | INTRAVENOUS | Status: DC
  Administered 2017-07-25 – 2017-07-28 (×6): 3 mL via INTRAVENOUS

## 2017-07-25 MED ORDER — FUROSEMIDE 10 MG/ML IJ SOLN
20.0000 mg | Freq: Two times a day (BID) | INTRAMUSCULAR | Status: DC
  Administered 2017-07-25 – 2017-07-28 (×7): 20 mg via INTRAVENOUS
  Filled 2017-07-25 (×6): qty 2

## 2017-07-25 MED ORDER — PANTOPRAZOLE SODIUM 40 MG PO TBEC
40.0000 mg | DELAYED_RELEASE_TABLET | Freq: Two times a day (BID) | ORAL | Status: DC
  Administered 2017-07-25 – 2017-07-28 (×6): 40 mg via ORAL
  Filled 2017-07-25 (×6): qty 1

## 2017-07-25 MED ORDER — HEPARIN SODIUM (PORCINE) 5000 UNIT/ML IJ SOLN
5000.0000 [IU] | Freq: Three times a day (TID) | INTRAMUSCULAR | Status: DC
  Administered 2017-07-25 – 2017-07-28 (×10): 5000 [IU] via SUBCUTANEOUS
  Filled 2017-07-25 (×9): qty 1

## 2017-07-25 NOTE — Progress Notes (Addendum)
SATURATION QUALIFICATIONS: (This note is used to comply with regulatory documentation for home oxygen)  Patient Saturations on Room Air at Rest = 88%  Patient Saturations on Room Air while Ambulating = n/a%  Patient Saturations on 2 Liters of oxygen while Ambulating = 96%  Please briefly explain why patient needs home oxygen:

## 2017-07-25 NOTE — Evaluation (Signed)
Physical Therapy Evaluation Patient Details Name: Mallory Rios MRN: [id number] DOB: 18-May-1927 Today's Date: 07/25/2017   History of Present Illness  presented to ER secondary to SOB, LE swelling; admitted with acute respiratory failure scondary to pulmonary edema, anemia.  Clinical Impression  Upon evaluation, patient alert and oriented to basic information. Initially refuses participation with therapy (stating she doesn't need to do that much), but agreeable to OOB to chair with encouragement from therapist.  Patient generally weak and deconditioned with marked deficit in standing balance reactions.  Requires constant hands-on, min assist with RW for safety with all mobility (sit/stand, basic transfers and short-distance gait).  Refuses gait/activity beyond bed/chair at this time. Sats maintained >90% on 2L supplemental O2; anticipate possible need for O2 upon discharge.   Would benefit from skilled PT to address above deficits and promote optimal return to PLOF; Recommend transition to [place] upon discharge from acute hospitalization.  Question ability to fully/actively participate and tolerate intensity of rehab at [facility]; may benefit, instead, from max support in home environment (HHOT, [place], [facility], [place] CSW)      Follow Up Recommendations Home health PT([facility], [facility], [facility] CSW)    Equipment Recommendations       Recommendations for Other Services       Precautions / Restrictions Precautions Precautions: Fall Restrictions Weight Bearing Restrictions: No      Mobility  Bed Mobility Overal bed mobility: Needs Assistance Bed Mobility: Supine to Sit     Supine to sit: Min guard     General bed mobility comments: increased time required  Transfers Overall transfer level: Needs assistance Equipment used: Rolling walker (2 wheeled) Transfers: Sit to/from Stand Sit to Stand: Min assist         General transfer comment: assist for lift off, anterior weight  translation  Ambulation/Gait Ambulation/Gait assistance: Min assist Ambulation Distance (Feet): (5' x2 with transfers) Assistive device: Rolling walker (2 wheeled)       General Gait Details: very forward flexed posture; short, choppy steps with limited balance reactions evident.    Stairs            Wheelchair Mobility    Modified Rankin (Stroke Patients Only)       Balance Overall balance assessment: Needs assistance Sitting-balance support: No upper extremity supported;Feet supported Sitting balance-Leahy Scale: Good     Standing balance support: Bilateral upper extremity supported Standing balance-Leahy Scale: Poor                               Pertinent Vitals/Pain Pain Assessment: No/denies pain    Home Living Family/patient expects to be discharged to:: Private residence Living Arrangements: Spouse/significant other Available Help at Discharge: Family Type of Home: House Home Access: Stairs to enter   Technical brewer of Steps: 1 Home Layout: Two level;Able to live on main level with bedroom/bathroom Home Equipment: Walker - 2 wheels      Prior Function Level of Independence: Independent with assistive device(s);Needs assistance         Comments: Per patient, mod indep (but has assist from husband if/as needed) for limited household ambulation.  Has aide to assist (1x/week) to assist with ADLs.  Endorses at least one fall within previous six months.     Hand Dominance   Dominant Hand: Right    Extremity/Trunk Assessment   Upper Extremity Assessment Upper Extremity Assessment: Generalized weakness    Lower Extremity Assessment Lower Extremity Assessment: Generalized  weakness(grossly 3-/5 throughout; question some degree of bilat ankle PF contracture (limited cooperation with formal MMT))    Cervical / Trunk Assessment Cervical / Trunk Assessment: Kyphotic(significant thoracic kyphosis with curvature towards L)   Communication   Communication: No difficulties  Cognition Arousal/Alertness: Awake/alert Behavior During Therapy: WFL for tasks assessed/performed Overall Cognitive Status: Within Functional Limits for tasks assessed                                        General Comments      Exercises Other Exercises Other Exercises: Toilet transfer, SPT with RW, min assist for sit/stand and transfer; mod assist for static standing balance during hygiene (unable to maintain with unilateral UE support)   Assessment/Plan    PT Assessment Patient needs continued PT services  PT Problem List Decreased strength;Decreased range of motion;Decreased activity tolerance;Decreased balance;Decreased mobility;Decreased coordination;Decreased knowledge of use of DME;Cardiopulmonary status limiting activity;Decreased safety awareness;Decreased knowledge of precautions       PT Treatment Interventions DME instruction;Gait training;Functional mobility training;Therapeutic activities;Therapeutic exercise;Balance training;Stair training;Patient/family education    PT Goals (Current goals can be found in the Care Plan section)  Acute Rehab PT Goals Patient Stated Goal: to do a little bit PT Goal Formulation: With patient Time For Goal Achievement: 08/08/17 Potential to Achieve Goals: Fair    Frequency Min 2X/week   Barriers to discharge Decreased caregiver support      Co-evaluation               AM-PAC PT "6 Clicks" Daily Activity  Outcome Measure Difficulty turning over in bed (including adjusting bedclothes, sheets and blankets)?: A Little Difficulty moving from lying on back to sitting on the side of the bed? : A Little Difficulty sitting down on and standing up from a chair with arms (e.g., wheelchair, bedside commode, etc,.)?: Unable Help needed moving to and from a bed to chair (including a wheelchair)?: A Little Help needed walking in hospital room?: A Little Help needed  climbing 3-5 steps with a railing? : A Lot 6 Click Score: 15    End of Session Equipment Utilized During Treatment: Gait belt Activity Tolerance: Patient limited by fatigue Patient left: in chair;with call bell/phone within reach;with chair alarm set Nurse Communication: Mobility status PT Visit Diagnosis: History of falling (Z91.81);Difficulty in walking, not elsewhere classified (R26.2)    Time: 2836-6294 PT Time Calculation (min) (ACUTE ONLY): 22 min   Charges:   PT Evaluation $PT Eval Moderate Complexity: 1 Mod PT Treatments $Therapeutic Activity: 8-22 mins   PT G Codes:        Angelo Caroll H. Owens Shark, PT, DPT, NCS 07/25/17, 5:30 PM [phone]

## 2017-07-25 NOTE — Progress Notes (Signed)
Patient is weaned down to 2L/[place] and still sating 100 %. Will continue to wean down. Patient rested well with no issues. Will continue to monitor.

## 2017-07-25 NOTE — Plan of Care (Signed)
  Problem: Education: Goal: Knowledge of General Education information will improve Outcome: Progressing   Problem: Health Behavior/Discharge Planning: Goal: Ability to manage health-related needs will improve Outcome: Progressing   Problem: Clinical Measurements: Goal: Ability to maintain clinical measurements within normal limits will improve Outcome: Progressing   

## 2017-07-25 NOTE — Plan of Care (Signed)
Patient becomes very dyspneic with exertion. Remind patient to concentrate on  breathing and avoid talking while performing activities. Encourage patient to engage in self activities.

## 2017-07-25 NOTE — Progress Notes (Signed)
[place] at [place] NAME: Mallory Rios    MR#:  [id number]  DATE OF BIRTH:  03-01-1928  Admitted for acute on chronic diastolic heart failure, on IV Lasix.  She says she is little better than yesterday.  Still requiring oxygen, desatted to upper 80s requiring 2 L of oxygen.  CHIEF COMPLAINT:   Chief Complaint  Patient presents with  . Shortness of Breath    REVIEW OF SYSTEMS:    Review of Systems  Constitutional: Negative for chills and fever.  HENT: Negative for hearing loss.   Eyes: Negative for blurred vision, double vision and photophobia.  Respiratory: Positive for cough and shortness of breath. Negative for hemoptysis.   Cardiovascular: Negative for palpitations, orthopnea and leg swelling.  Gastrointestinal: Negative for abdominal pain, diarrhea and vomiting.  Genitourinary: Negative for dysuria and urgency.  Musculoskeletal: Negative for myalgias and neck pain.  Skin: Negative for rash.  Neurological: Negative for dizziness, focal weakness, seizures, weakness and headaches.  Psychiatric/Behavioral: Negative for memory loss. The patient does not have insomnia.     Nutrition:  Tolerating Diet: Tolerating PT:      DRUG ALLERGIES:   Allergies  Allergen Reactions  . Demerol [Meperidine]   . Other Other (See Comments)  . Propoxyphene     VITALS:  Blood pressure (!) 151/95, pulse 66, temperature 97.7 F (36.5 C), resp. rate 14, height 4\' 8"  (1.422 m), weight 52.6 kg (116 lb), SpO2 100 %.  PHYSICAL EXAMINATION:   Physical Exam  GENERAL:  84 y.o.-year-old patient lying in the bed with no acute distress.  EYES: Pupils equal, round, reactive to light and accommodation. No scleral icterus. Extraocular muscles intact.  HEENT: Head atraumatic, normocephalic. Oropharynx and nasopharynx clear.  NECK:  Supple, no jugular venous distention. No thyroid enlargement, no tenderness.  LUNGS: Decreased breath sounds in  general bilaterally but no rales.  No wheezing.   CARDIOVASCULAR: S1, S2 normal. No murmurs, rubs, or gallops.  ABDOMEN: Soft, nontender, nondistended. Bowel sounds present. No organomegaly or mass.  EXTREMITIES: No pedal edema, cyanosis, or clubbing.  NEUROLOGIC: Cranial nerves II through XII are intact. Muscle strength 5/5 in all extremities. Sensation intact. Gait not checked.  PSYCHIATRIC: The patient is alert and oriented x 3.  SKIN: No obvious rash, lesion, or ulcer.    LABORATORY PANEL:   CBC Recent Labs  Lab 07/24/17 0658  WBC 7.5  HGB 9.3*  HCT 27.5*  PLT 354   ------------------------------------------------------------------------------------------------------------------  Chemistries  Recent Labs  Lab 07/23/17 2000 07/24/17 0658  NA 139 139  K 3.6 3.2*  CL 103 100*  CO2 27 31  GLUCOSE 107* 90  BUN 24* 18  CREATININE 0.54 0.67  CALCIUM 8.5* 8.1*  AST 21  --   ALT 12*  --   ALKPHOS 74  --   BILITOT 0.6  --    ------------------------------------------------------------------------------------------------------------------  Cardiac Enzymes Recent Labs  Lab 07/24/17 1252  TROPONINI <0.03   ------------------------------------------------------------------------------------------------------------------  RADIOLOGY:  Dg Chest Port 1 View  Result Date: 07/23/2017 CLINICAL DATA:  Exertional dyspnea EXAM: PORTABLE CHEST 1 VIEW COMPARISON:  06/01/2016 FINDINGS: Stable cardiomegaly with moderate aortic atherosclerosis at the arch. No aneurysm. Double density over the cardiac silhouette consistent with a moderate to large hiatal hernia. Moderate right effusion with blunting of the costophrenic angles. Mild diffuse interstitial edema is seen. No acute osseous abnormality. IMPRESSION: Cardiomegaly with mild interstitial edema and small right pleural effusion. Aortic atherosclerosis without aneurysm. Hiatal  hernia. Electronically Signed   By: Ashley Royalty M.D.   On:  07/23/2017 20:26     ASSESSMENT AND PLAN:   Active Problems:   Acute respiratory failure (HCC)  Acute respiratory failure with hypoxia secondary to acute on chronic diastolic heart failure: Improving with the diuresis.  O2 saturation 91% on room air when she came on 3 L 100% oxygen.   Continue low-salt diet, IV diuretics , l#2 acute on chronic diastolic heart failure: Continue IV Lasix.  Patient hypoxic on room air at rest with sats of 88% requiring oxygen 2 L, on 2 sats up to 96% on ambulation.  Likely discharge home tomorrow, continue IV diuretics for today, 3./Chronic A. fib: Rate controlled.  New small dose beta-blocker, aspirin. 4.  Change to inpatient status.  Hypokalemia: Replace the potassium.  Check potassium today. Iron deficiency anemia: Replace iron.  Continue ferrous sulfate. All the records are reviewed and case discussed with Care Management/Social Workerr. Management plans discussed with the patient, family and they are in agreement.  CODE STATUS: full  TOTAL TIME TAKING CARE OF THIS PATIENT: 1-2 minutes.   POSSIBLE D/C IN 1-2DAYS, DEPENDING ON CLINICAL CONDITION.   Epifanio Lesches M.D on 07/25/2017 at 2:02 PM  Between 7am to 6pm - Pager - [phone]  After 6pm go to [email or web address] - password [email or web address] [place] [facility]  Office  [phone]  CC: Primary care physician; Valera Castle, MD

## 2017-07-26 LAB — GLUCOSE, CAPILLARY: GLUCOSE-CAPILLARY: 97 mg/dL (ref 65–99)

## 2017-07-26 MED ORDER — POTASSIUM CHLORIDE CRYS ER 20 MEQ PO TBCR
40.0000 meq | EXTENDED_RELEASE_TABLET | Freq: Once | ORAL | Status: AC
  Administered 2017-07-26: 40 meq via ORAL
  Filled 2017-07-26: qty 2

## 2017-07-26 NOTE — Progress Notes (Addendum)
[place] at [place] NAME: Mallory Rios    MR#:  [id number]  DATE OF BIRTH:  [birth year]  Patient has mild shortness of breath but better than yesterday.  Saturation 95% on 1 L.  Says that she does not want to go home on oxygen.  CHIEF COMPLAINT:   Chief Complaint  Patient presents with  . Shortness of Breath    REVIEW OF SYSTEMS:    Review of Systems  Constitutional: Negative for chills and fever.  HENT: Negative for hearing loss.   Eyes: Negative for blurred vision, double vision and photophobia.  Respiratory: Positive for cough and shortness of breath. Negative for hemoptysis.   Cardiovascular: Negative for palpitations, orthopnea and leg swelling.  Gastrointestinal: Negative for abdominal pain, diarrhea and vomiting.  Genitourinary: Negative for dysuria and urgency.  Musculoskeletal: Negative for myalgias and neck pain.  Skin: Negative for rash.  Neurological: Negative for dizziness, focal weakness, seizures, weakness and headaches.  Psychiatric/Behavioral: Negative for memory loss. The patient does not have insomnia.     Nutrition:  Tolerating Diet: Tolerating PT:      DRUG ALLERGIES:   Allergies  Allergen Reactions  . Demerol [Meperidine]   . Other Other (See Comments)  . Propoxyphene     VITALS:  Blood pressure (!) 132/55, pulse 71, temperature 98.1 F (36.7 C), temperature source Oral, resp. rate 18, height 4\' 8"  (1.422 m), weight 51.9 kg (114 lb 6.4 oz), SpO2 95 %.  PHYSICAL EXAMINATION:   Physical Exam  GENERAL:  84 y.o.-year-old patient lying in the bed with no acute distress.  EYES: Pupils equal, round, reactive to light and accommodation. No scleral icterus. Extraocular muscles intact.  HEENT: Head atraumatic, normocephalic. Oropharynx and nasopharynx clear.  NECK:  Supple, no jugular venous distention. No thyroid enlargement, no tenderness.  LUNGS: Decreased breath sounds in general  bilaterally but no rales.  No wheezing.   CARDIOVASCULAR: S1, S2 normal. No murmurs, rubs, or gallops.  ABDOMEN: Soft, nontender, nondistended. Bowel sounds present. No organomegaly or mass.  EXTREMITIES: trace pedal edema, NEUROLOGIC: Cranial nerves II through XII are intact. Muscle strength 5/5 in all extremities. Sensation intact. Gait not checked.  PSYCHIATRIC: The patient is alert and oriented x 3.  SKIN: No obvious rash, lesion, or ulcer.    LABORATORY PANEL:   CBC Recent Labs  Lab 07/24/17 0658  WBC 7.5  HGB 9.3*  HCT 27.5*  PLT 354   ------------------------------------------------------------------------------------------------------------------  Chemistries  Recent Labs  Lab 07/23/17 2000  07/25/17 1420  NA 139   < > 138  K 3.6   < > 3.3*  CL 103   < > 96*  CO2 27   < > 33*  GLUCOSE 107*   < > 115*  BUN 24*   < > 22*  CREATININE 0.54   < > 0.68  CALCIUM 8.5*   < > 8.7*  AST 21  --   --   ALT 12*  --   --   ALKPHOS 74  --   --   BILITOT 0.6  --   --    < > = values in this interval not displayed.   ------------------------------------------------------------------------------------------------------------------  Cardiac Enzymes Recent Labs  Lab 07/24/17 1252  TROPONINI <0.03   ------------------------------------------------------------------------------------------------------------------  RADIOLOGY:  No results found.   ASSESSMENT AND PLAN:   Active Problems:   Acute respiratory failure (HCC)  Acute respiratory failure with hypoxia secondary to acute on chronic diastolic heart  failure: Improving with the diuresis.  O2 saturation 91% on room air when she came on 3 L 100% oxygen.   Continue low-salt diet, IV diuretics , l#2 acute on chronic diastolic heart failure: Continue IV Lasix.  Patient hypoxic on room air at rest with sats of 88% requiring oxygen 2 L, on 2 sats up to 96% on ambulation.  Continue IV diuretics for 1 more day, wean off oxygen.   She told me that she does not want to go home on oxygen.  echo cardiogram showed EF 55 to 60%. 3./Chronic A. fib: Rate controlled.continue small dose beta-blocker, aspirin. 4. Hypokalemia: Replace the potassium. continue  potassiumsupplements while she is on diuretics.  Iron deficiency anemia: Replace iron.  Continue ferrous sulfate. All the records are reviewed and case discussed with Care Management/Social Workerr. Management plans discussed with the patient, family and they are in agreement.  CODE STATUS: full  TOTAL TIME TAKING CARE OF THIS PATIENT: 35  minutes.   POSSIBLE D/C IN 1-2DAYS, DEPENDING ON CLINICAL CONDITION.   Epifanio Lesches M.D on 07/26/2017 at 1:59 PM  Between 7am to 6pm - Pager - [phone]  After 6pm go to [email or web address] - password [email or web address] [place] [facility]  Office  [phone]  CC: Primary care physician; Valera Castle, MD

## 2017-07-26 NOTE — Plan of Care (Signed)
  Problem: Education: Goal: Knowledge of General Education information will improve Outcome: Progressing   Problem: Health Behavior/Discharge Planning: Goal: Ability to manage health-related needs will improve Outcome: Progressing   Problem: Clinical Measurements: Goal: Will remain free from infection Outcome: Progressing   

## 2017-07-26 NOTE — Progress Notes (Addendum)
SATURATION QUALIFICATIONS: (This note is used to comply with regulatory documentation for home oxygen)  Patient Saturations on Room Air at Rest = 88%  Patient Saturations on Room Air while Ambulating = n/a%  Patient Saturations on 2 Liters of oxygen while Ambulating = 93%  Please briefly explain why patient needs home oxygen:  Tried to wean patient off O2 but when we tried to ambulate patient became dyspneic, per patient feels like "heart beating too fast." HR in the low 100's. Placed patient back on 2 L N/C. Will continue to monitor.

## 2017-07-26 NOTE — Evaluation (Signed)
Occupational Therapy Evaluation Patient Details Name: Mallory Rios MRN: [id number] DOB: 1928/03/26 Today's Date: 07/26/2017    History of Present Illness presented to ER secondary to SOB, LE swelling; admitted with acute respiratory failure scondary to pulmonary edema, anemia.   Clinical Impression   Met pt lying in bed, agreeable to OT this date. Pt reports that BLE swelling has decreased from admission. Offered pt OOB opportunity to sit in chair for upcoming meal, pt denied stating she would rather wait closer to meal time. Educated on importance of OOB activity to promote respiratory health and engagement in BADL routines, pt understanding and politely declined, but agreeable to eat meals OOB in the future. Education given on importance of energy conservation strategies in the home with BADL routines. Further education given on strategic placement of chairs in the home for safety when feeling SOB, pt reports not necessary because she "does not need to go too far". Pt reports assist from friend for sponge bathing and dressing and occasional meal prep and grocery delivery. When discussed possibility of [place], pt states "my husband won't allow that". Educated on the importance of home safety and assist from healthcare professionals, pt with understanding but still unsure of further plan. Pt would benefit from [facility] and [facility] for BADL routines in the home. Left pt lying in bed, call light in place and bed alarm on.    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, pt needs set up A to open packages for meal Grooming: Set up Grooming Details (indicate cue type and reason): Anticipate set up A for pt 2/2 to decreased activity tolerance Upper Body Bathing: Moderate assistance Upper Body Bathing Details (indicate cue type and reason): Anticipate mod A 2/2 to decreased activity tolerance, pt reports assist from friend to complete bathing at baseline Lower Body Bathing: Moderate assistance Lower Body Bathing Details (indicate cue type and reason): Anticipate mod A 2/2 to decreased activity tolerance, pt reports assist from friend to complete bathing at baseline Upper Body Dressing : Moderate assistance Upper Body Dressing Details (indicate cue type and reason): Anticipate mod A 2/2 to decreased activity tolerance and decreased [place] and UE strength, pt reports assist from friend to complete dressing at baseline Lower Body Dressing: Moderate assistance Lower Body Dressing Details (indicate cue type and reason): Anticipate mod A 2/2 to decreased activity tolerance and decreased [place] and UE strength, pt reports assist from friend to complete dressing at baseline Toilet Transfer: Moderate assistance Toilet Transfer Details (indicate cue type and reason): Anticipate mod assist 2/2 to decreased activity tolerance and  decreased [place] and UE strength Toileting- Clothing Manipulation and Hygiene: Minimal assistance Toileting - Clothing Manipulation Details (indicate cue type and reason): anticipate min A 2/2 to decreased activity tolerance and decreased [place] and UE strength   Tub/Shower Transfer Details (indicate cue type and reason): Pt does not t/f to tub/shower at baseline, pt completes sponge bathing with help of friend Functional mobility during ADLs: Moderate assistance General ADL Comments: Anticipate  pt needing mod A support for decreased activity tolerance and strength to safely engage in ADL activity     Vision Baseline Vision/History: Wears glasses Patient Visual Report: No change from baseline       Perception     Praxis      Pertinent Vitals/Pain Pain Assessment: 0-10 Pain Score: 4  Pain Location: feet Pain Intervention(s): Monitored during session     Hand Dominance Right   Extremity/Trunk Assessment Upper Extremity Assessment Upper Extremity Assessment: Generalized weakness(roughly 3/5 in UE strength. Grip strength affected due to OA deformities at baseline (per pt))           Communication Communication Communication: HOH   Cognition Arousal/Alertness: Awake/alert Behavior During Therapy: WFL for tasks assessed/performed Overall Cognitive Status: Within Functional Limits for tasks assessed                                     General Comments       Exercises Other Exercises Other Exercises: Education given in regard to energy conservation to maintain respiratory health and to support activity tolerance decline and home adaptations. Pt only midly accepting claiming she does not "have to go far"   Shoulder Instructions      Home Living Family/patient expects to be discharged to:: Private residence Living Arrangements: Spouse/significant other Available Help at Discharge: Family;Friend(s);Other (Comment)(recieves PRN assist from friend, no children) Type of Home: House Home Access: Stairs to enter Entrance Stairs-Number of Steps: 1   Home Layout: Two level;Able to live on main level with bedroom/bathroom               Home Equipment: Gilford Rile - 2 wheels;Bedside commode(not consistently using BSC, but available)          Prior Functioning/Environment Level of Independence: Needs assistance;Independent with assistive device(s)    ADL's / Homemaking Assistance Needed: Per pt, friend helps with dressing and bathing and  occasionally assists with meal prep/delivery. husband able to assist with light tasks per pt            OT Problem List: Decreased strength;Decreased knowledge of use of DME or AE;Decreased activity tolerance;Cardiopulmonary status limiting activity;Impaired UE functional use      OT Treatment/Interventions: Self-care/ADL training;Balance training;Energy conservation;Therapeutic activities;DME and/or AE instruction;Patient/family education    OT Goals(Current goals can be found in the care plan section) Acute Rehab OT Goals Patient Stated Goal: to get home without oxygen at highest functional level possible OT Goal Formulation: With patient Time For Goal Achievement: 08/09/17 Potential to Achieve Goals: Good  OT Frequency: Min 2X/week   Barriers to D/C: Decreased caregiver support  Pt reports light help from husband and PRN assist from friend this date       Co-evaluation              AM-PAC PT "6 Clicks" Daily Activity     Outcome Measure Help from another person eating meals?: A Little Help from another person taking care of personal grooming?: A Little  Help from another person toileting, which includes using toliet, bedpan, or urinal?: A Lot Help from another person bathing (including washing, rinsing, drying)?: Total Help from another person to put on and taking off regular upper body clothing?: A Lot Help from another person to put on and taking off regular lower body clothing?: A Lot 6 Click Score: 13   End of Session    Activity Tolerance: Patient limited by fatigue Patient left: in bed;with call bell/phone within reach;with bed alarm set  OT Visit Diagnosis: Muscle weakness (generalized) (M62.81);Unsteadiness on feet (R26.81);Other abnormalities of gait and mobility (R26.89)                Time: 1356-1410 OT Time Calculation (min): 14 min Charges:  OT General Charges $OT Visit: 1 Visit OT Evaluation $OT Eval High Complexity: 1 High OT Treatments $Self  Care/Home Management : 8-22 mins G-Codes:     Zenovia Jarred, MSOT, OTR/L   [place] 07/26/2017, 4:36 PM

## 2017-07-26 NOTE — Care Management Important Message (Signed)
Copy of signed IM left in patient's room.    

## 2017-07-26 NOTE — Progress Notes (Signed)
[place] at [place] NAME: Mallory Rios    MR#:  [id number]  DATE OF BIRTH:  08-Sep-1927  Patient has mild shortness of breath but better than yesterday.  Saturation 95% on 1 L.  Says that she does not want to go home on oxygen.  CHIEF COMPLAINT:   Chief Complaint  Patient presents with  . Shortness of Breath    REVIEW OF SYSTEMS:    Review of Systems  Constitutional: Negative for chills and fever.  HENT: Negative for hearing loss.   Eyes: Negative for blurred vision, double vision and photophobia.  Respiratory: Positive for cough and shortness of breath. Negative for hemoptysis.   Cardiovascular: Negative for palpitations, orthopnea and leg swelling.  Gastrointestinal: Negative for abdominal pain, diarrhea and vomiting.  Genitourinary: Negative for dysuria and urgency.  Musculoskeletal: Negative for myalgias and neck pain.  Skin: Negative for rash.  Neurological: Negative for dizziness, focal weakness, seizures, weakness and headaches.  Psychiatric/Behavioral: Negative for memory loss. The patient does not have insomnia.     Nutrition:  Tolerating Diet: Tolerating PT:      DRUG ALLERGIES:   Allergies  Allergen Reactions  . Demerol [Meperidine]   . Other Other (See Comments)  . Propoxyphene     VITALS:  Blood pressure (!) 132/55, pulse 71, temperature 98.1 F (36.7 C), temperature source Oral, resp. rate 18, height 4\' 8"  (1.422 m), weight 51.9 kg (114 lb 6.4 oz), SpO2 95 %.  PHYSICAL EXAMINATION:   Physical Exam  GENERAL:  84 y.o.-year-old patient lying in the bed with no acute distress.  EYES: Pupils equal, round, reactive to light and accommodation. No scleral icterus. Extraocular muscles intact.  HEENT: Head atraumatic, normocephalic. Oropharynx and nasopharynx clear.  NECK:  Supple, no jugular venous distention. No thyroid enlargement, no tenderness.  LUNGS: Decreased breath sounds in general  bilaterally but no rales.  No wheezing.   CARDIOVASCULAR: S1, S2 normal. No murmurs, rubs, or gallops.  ABDOMEN: Soft, nontender, nondistended. Bowel sounds present. No organomegaly or mass.  EXTREMITIES: trace pedal edema, NEUROLOGIC: Cranial nerves II through XII are intact. Muscle strength 5/5 in all extremities. Sensation intact. Gait not checked.  PSYCHIATRIC: The patient is alert and oriented x 3.  SKIN: No obvious rash, lesion, or ulcer.    LABORATORY PANEL:   CBC Recent Labs  Lab 07/24/17 0658  WBC 7.5  HGB 9.3*  HCT 27.5*  PLT 354   ------------------------------------------------------------------------------------------------------------------  Chemistries  Recent Labs  Lab 07/23/17 2000  07/25/17 1420  NA 139   < > 138  K 3.6   < > 3.3*  CL 103   < > 96*  CO2 27   < > 33*  GLUCOSE 107*   < > 115*  BUN 24*   < > 22*  CREATININE 0.54   < > 0.68  CALCIUM 8.5*   < > 8.7*  AST 21  --   --   ALT 12*  --   --   ALKPHOS 74  --   --   BILITOT 0.6  --   --    < > = values in this interval not displayed.   ------------------------------------------------------------------------------------------------------------------  Cardiac Enzymes Recent Labs  Lab 07/24/17 1252  TROPONINI <0.03   ------------------------------------------------------------------------------------------------------------------  RADIOLOGY:  No results found.   ASSESSMENT AND PLAN:   Active Problems:   Acute respiratory failure (HCC)  Acute respiratory failure with hypoxia secondary to acute on chronic diastolic heart  failure: Improving with the diuresis.  O2 saturation 91% on room air when she came on 3 L 100% oxygen.   Continue low-salt diet, IV diuretics , l#2 acute on chronic diastolic heart failure: Continue IV Lasix.  Patient hypoxic on room air at rest with sats of 88% requiring oxygen 2 L, on 2 sats up to 96% on ambulation.  Continue IV diuretics for 1 more day, wean off oxygen.   She told me that she does not want to go home on oxygen.  echo cardiogram showed EF 55 to 60%. 3./Chronic A. fib: Rate controlled.continue small dose beta-blocker, aspirin. 4. Hypokalemia: Replace the potassium. continue  potassiumsupplements while she is on diuretics.  Iron deficiency anemia: Replace iron.  Continue ferrous sulfate. All the records are reviewed and case discussed with Care Management/Social Workerr. Management plans discussed with the patient, family and they are in agreement.  CODE STATUS: full  TOTAL TIME TAKING CARE OF THIS PATIENT: 35 min POSSIBLE D/C IN 1-2DAYS, DEPENDING ON CLINICAL CONDITION.   Epifanio Lesches M.D on 07/26/2017 at 2:03 PM  Between 7am to 6pm - Pager - [phone]  After 6pm go to [email or web address] - password [email or web address] [facility]  Office  [phone]  CC: Primary care physician; [place], MD

## 2017-07-27 LAB — CBC
HEMATOCRIT: 29.5 % — AB (ref 35.0–47.0)
HEMOGLOBIN: 9.3 g/dL — AB (ref 12.0–16.0)
MCH: 24.1 pg — ABNORMAL LOW (ref 26.0–34.0)
MCHC: 31.4 g/dL — ABNORMAL LOW (ref 32.0–36.0)
MCV: 76.9 fL — ABNORMAL LOW (ref 80.0–100.0)
Platelets: 381 10*3/uL (ref 150–440)
RBC: 3.84 MIL/uL (ref 3.80–5.20)
RDW: 17.5 % — ABNORMAL HIGH (ref 11.5–14.5)
WBC: 8.9 10*3/uL (ref 3.6–11.0)

## 2017-07-27 LAB — GLUCOSE, CAPILLARY: GLUCOSE-CAPILLARY: 88 mg/dL (ref 65–99)

## 2017-07-27 NOTE — Progress Notes (Signed)
   07/27/17 1000  Clinical Encounter Type  Visited With Patient  Visit Type Other (Comment);Initial (HCPOA)  Referral From Physician  Spiritual Encounters  Spiritual Needs Emotional   HCPOA Request - [place] spoke with patient at bedside; patient indicates she currently does not need AD since her husband takes care of her.

## 2017-07-27 NOTE — Progress Notes (Signed)
Family Meeting Note  Advance Directive:yes  Today a meeting took place with the Patient.  Patient wants to be full code.  Discussed that with her advanced age and also heart failure, A. fib recommended DNR statusshe says she never thought about that. The following clinical team members were present during this meeting:MD  The following were discussed:.  About her heart failure, hypertension, advanced age, severe deconditioning.  Will involve hospital chaplain regarding advanced directives.  Time spent during discussion:15 minutes  Epifanio Lesches, MD

## 2017-07-27 NOTE — Progress Notes (Signed)
[place] at [place] NAME: Mallory Rios    MR#:  [id number]  DATE OF BIRTH:  14-Feb-1928  Patient is feeling better but still has shortness of breath, patient feels short of breath without oxygen she feels like palpitations so put back on 2 L oxygen.  Want to stay till tomorrow.  CHIEF COMPLAINT:   Chief Complaint  Patient presents with  . Shortness of Breath    REVIEW OF SYSTEMS:    Review of Systems  Constitutional: Negative for chills and fever.  HENT: Negative for hearing loss.   Eyes: Negative for blurred vision, double vision and photophobia.  Respiratory: Positive for cough and shortness of breath. Negative for hemoptysis.   Cardiovascular: Negative for palpitations, orthopnea and leg swelling.  Gastrointestinal: Negative for abdominal pain, diarrhea and vomiting.  Genitourinary: Negative for dysuria and urgency.  Musculoskeletal: Negative for myalgias and neck pain.  Skin: Negative for rash.  Neurological: Negative for dizziness, focal weakness, seizures, weakness and headaches.  Psychiatric/Behavioral: Negative for memory loss. The patient does not have insomnia.     Nutrition:  Tolerating Diet: Tolerating PT:      DRUG ALLERGIES:   Allergies  Allergen Reactions  . Demerol [Meperidine]   . Other Other (See Comments)  . Propoxyphene     VITALS:  Blood pressure (!) 163/57, pulse 71, temperature 98.3 F (36.8 C), temperature source Oral, resp. rate 18, height 4\' 8"  (1.422 m), weight 49.9 kg (110 lb), SpO2 100 %.  PHYSICAL EXAMINATION:   Physical Exam  GENERAL:  84 y.o.-year-old patient lying in the bed with no acute distress.  EYES: Pupils equal, round, reactive to light and accommodation. No scleral icterus. Extraocular muscles intact.  HEENT: Head atraumatic, normocephalic. Oropharynx and nasopharynx clear.  NECK:  Supple, no jugular venous distention. No thyroid enlargement, no tenderness.  LUNGS:  Decreased breath sounds in general bilaterally but no rales.  No wheezing.   CARDIOVASCULAR: S1, S2 normal. No murmurs, rubs, or gallops.  ABDOMEN: Soft, nontender, nondistended. Bowel sounds present. No organomegaly or mass.  EXTREMITIES: trace pedal edema, NEUROLOGIC: Cranial nerves II through XII are intact. Muscle strength 5/5 in all extremities. Sensation intact. Gait not checked.  PSYCHIATRIC: The patient is alert and oriented x 3.  SKIN: No obvious rash, lesion, or ulcer.    LABORATORY PANEL:   CBC Recent Labs  Lab 07/27/17 0605  WBC 8.9  HGB 9.3*  HCT 29.5*  PLT 381   ------------------------------------------------------------------------------------------------------------------  Chemistries  Recent Labs  Lab 07/23/17 2000  07/25/17 1420  NA 139   < > 138  K 3.6   < > 3.3*  CL 103   < > 96*  CO2 27   < > 33*  GLUCOSE 107*   < > 115*  BUN 24*   < > 22*  CREATININE 0.54   < > 0.68  CALCIUM 8.5*   < > 8.7*  AST 21  --   --   ALT 12*  --   --   ALKPHOS 74  --   --   BILITOT 0.6  --   --    < > = values in this interval not displayed.   ------------------------------------------------------------------------------------------------------------------  Cardiac Enzymes Recent Labs  Lab 07/24/17 1252  TROPONINI <0.03   ------------------------------------------------------------------------------------------------------------------  RADIOLOGY:  No results found.   ASSESSMENT AND PLAN:   Active Problems:   Acute respiratory failure (HCC)  Acute respiratory failure with hypoxia secondary to acute  on chronic diastolic heart failure: Improving with the diuresis.  Oxygen improved however she feels palpitations without oxygen so she is on oxygen for symptoms.  Continue IV diuretics today, ambulate, out of bed to chair as much as possible.  Severe deconditioning needs physical therapy at home., l#2 acute on chronic diastolic heart failure: Continue IV Lasix.   Patient hypoxic on room air at rest with sats of 88% requiring oxygen 2 L, on 2 sats up to 96% on ambulation.  Continue IV diuretics wean off oxygen.  She told me that she does not want to go home on oxygen.  echo cardiogram showed EF 55 to 60%. 3./Chronic A. fib: Rate controlled.continue small dose beta-blocker, aspirin. 4. Hypokalemia: Replace the potassium. continue  potassiumsupplements while she is on diuretics.  Iron deficiency anemia: Replace iron.  Continue ferrous sulfate. All the records are reviewed and case discussed with Care Management/Social Workerr. Management plans discussed with the patient, family and they are in agreement.  CODE STATUS: full  TOTAL TIME TAKING CARE OF THIS PATIENT: 35 min POSSIBLE D/C IN 1-2DAYS, DEPENDING ON CLINICAL CONDITION.   Epifanio Lesches M.D on 07/27/2017 at 9:40 AM  Between 7am to 6pm - Pager - [phone]  After 6pm go to [email or web address] - password [email or web address] [place] [facility]  Office  [phone]  CC: Primary care physician; [place], MD

## 2017-07-28 LAB — BASIC METABOLIC PANEL
ANION GAP: 11 (ref 5–15)
BUN: 15 mg/dL (ref 6–20)
CHLORIDE: 96 mmol/L — AB (ref 101–111)
CO2: 30 mmol/L (ref 22–32)
CREATININE: 0.44 mg/dL (ref 0.44–1.00)
Calcium: 8.7 mg/dL — ABNORMAL LOW (ref 8.9–10.3)
GFR calc non Af Amer: >60 mL/min (ref >60)
Glucose, Bld: 92 mg/dL (ref 65–99)
POTASSIUM: 4.1 mmol/L (ref 3.5–5.1)
SODIUM: 137 mmol/L (ref 135–145)

## 2017-07-28 NOTE — Progress Notes (Signed)
Patient states she "does not want or need oxygen".  Patient states she has had oxygen before and has not used it. Patient's oxygen at room air is 89 but will increase to 90, 91. Per case manager patient does not qualify for home oxygen. Will continue to monitor patient.

## 2017-07-28 NOTE — Plan of Care (Signed)
  Problem: Education: Goal: Knowledge of General Education information will improve Outcome: Adequate for Discharge   Problem: Health Behavior/Discharge Planning: Goal: Ability to manage health-related needs will improve Outcome: Adequate for Discharge   Problem: Clinical Measurements: Goal: Ability to maintain clinical measurements within normal limits will improve Outcome: Adequate for Discharge Goal: Will remain free from infection Outcome: Adequate for Discharge Goal: Diagnostic test results will improve Outcome: Adequate for Discharge Goal: Respiratory complications will improve Outcome: Adequate for Discharge Goal: Cardiovascular complication will be avoided Outcome: Adequate for Discharge   Problem: Activity: Goal: Risk for activity intolerance will decrease Outcome: Adequate for Discharge   Problem: Nutrition: Goal: Adequate nutrition will be maintained Outcome: Adequate for Discharge   Problem: Coping: Goal: Level of anxiety will decrease Outcome: Adequate for Discharge   Problem: Elimination: Goal: Will not experience complications related to bowel motility Outcome: Adequate for Discharge Goal: Will not experience complications related to urinary retention Outcome: Adequate for Discharge   Problem: Pain Managment: Goal: General experience of comfort will improve Outcome: Adequate for Discharge   Problem: Safety: Goal: Ability to remain free from injury will improve Outcome: Adequate for Discharge   Problem: Skin Integrity: Goal: Risk for impaired skin integrity will decrease Outcome: Adequate for Discharge   Problem: Education: Goal: Ability to demonstrate management of disease process will improve Outcome: Adequate for Discharge Goal: Ability to verbalize understanding of medication therapies will improve Outcome: Adequate for Discharge   Problem: Activity: Goal: Capacity to carry out activities will improve Outcome: Adequate for Discharge    Problem: Cardiac: Goal: Ability to achieve and maintain adequate cardiopulmonary perfusion will improve Outcome: Adequate for Discharge   

## 2017-07-28 NOTE — Plan of Care (Signed)
Patient appears unrealistic about healthcare goals. Patient does not want to use oxygen at home but requires it.

## 2017-07-28 NOTE — Progress Notes (Addendum)
SATURATION QUALIFICATIONS: (This note is used to comply with regulatory documentation for home oxygen)  Patient Saturations on Room Air at Rest = 89%  Patient Saturations on Room Air while Ambulating = NA%  Patient Saturations on 2Liters of oxygen while Ambulating = 95%  Please briefly explain why patient needs home oxygen: patient's oxygen saturation drops on room air

## 2017-07-28 NOTE — Progress Notes (Signed)
Physical Therapy Treatment Patient Details Name: Mallory Rios MRN: [id number] DOB: July 19, 1927 Today's Date: 07/28/2017    History of Present Illness      PT Comments    Pt in bed.  To edge of bed with increased time and supervision.  Pt requesting to use bedside commode.  Pt encouraged to ambulate 4' to commode at end of bed but demanded commode be brought close to her.  She was able to stand and transfer with min assist.  Pt given walker to use but she reached for bedside commode handle despite encouragement to use walker for support with both hands.  She requested assist for care "I can't wipe myself"  Assisted given.  She was able to ambulate 5' to recliner after voiding with walker.  She refused further gait at this time.  She did report stomach pain and primary nurse was notified of request for medication.  Pt generally agitated this session.   Follow Up Recommendations  Home health PT     Equipment Recommendations       Recommendations for Other Services       Precautions / Restrictions Precautions Precautions: Fall Restrictions Weight Bearing Restrictions: No    Mobility  Bed Mobility Overal bed mobility: Needs Assistance Bed Mobility: Supine to Sit     Supine to sit: Supervision     General bed mobility comments: increased time required  Transfers Overall transfer level: Needs assistance Equipment used: Rolling walker (2 wheeled) Transfers: Sit to/from Stand Sit to Stand: Min assist            Ambulation/Gait Ambulation/Gait assistance: Min assist Ambulation Distance (Feet): 5 Feet Assistive device: Rolling walker (2 wheeled) Gait Pattern/deviations: Step-through pattern;Decreased step length - right;Decreased step length - left;Trunk flexed   Gait velocity interpretation: <1.8 ft/sec, indicate of risk for recurrent falls General Gait Details: very forward flexed posture; short, choppy steps with limited balance reactions evident.     Stairs              Wheelchair Mobility    Modified Rankin (Stroke Patients Only)       Balance Overall balance assessment: Needs assistance Sitting-balance support: No upper extremity supported;Feet supported Sitting balance-Leahy Scale: Good     Standing balance support: Bilateral upper extremity supported Standing balance-Leahy Scale: Poor                              Cognition Arousal/Alertness: Awake/alert Behavior During Therapy: WFL for tasks assessed/performed;Agitated Overall Cognitive Status: Within Functional Limits for tasks assessed                                        Exercises Other Exercises Other Exercises: to commode at bedside.  assist for self care    General Comments        Pertinent Vitals/Pain Pain Assessment: 0-10 Pain Score: 4  Pain Location: stomach Pain Descriptors / Indicators: Aching Pain Intervention(s): Limited activity within patient's tolerance;Patient requesting pain meds-RN notified    Home Living                      Prior Function            PT Goals (current goals can now be found in the care plan section) Progress towards PT goals: Progressing toward goals    Frequency  Min 2X/week      PT Plan Current plan remains appropriate    Co-evaluation              AM-PAC PT "6 Clicks" Daily Activity  Outcome Measure  Difficulty turning over in bed (including adjusting bedclothes, sheets and blankets)?: A Little Difficulty moving from lying on back to sitting on the side of the bed? : A Little Difficulty sitting down on and standing up from a chair with arms (e.g., wheelchair, bedside commode, etc,.)?: Unable Help needed moving to and from a bed to chair (including a wheelchair)?: A Little Help needed walking in hospital room?: A Little Help needed climbing 3-5 steps with a railing? : A Lot 6 Click Score: 15    End of Session   Activity Tolerance: Other  (comment) Patient left: in chair;with chair alarm set;with call bell/phone within reach Nurse Communication: Patient requests pain meds       Time: 8867-7373 PT Time Calculation (min) (ACUTE ONLY): 10 min  Charges:  $Therapeutic Activity: 8-22 mins                    G Codes:       Chesley Noon, PTA 07/28/17, 10:13 AM

## 2017-07-28 NOTE — Discharge Summary (Signed)
[place] [facility] [place] NAME: Mallory Rios    MR#:  [id number]  DATE OF BIRTH:  1928-01-17  DATE OF ADMISSION:  07/23/2017 ADMITTING PHYSICIAN: Amelia Jo, MD  DATE OF DISCHARGE: No discharge date for patient encounter.  PRIMARY CARE PHYSICIAN: Valera Castle, MD    ADMISSION DIAGNOSIS:  Acute pulmonary edema ([place]) [J81.0]  DISCHARGE DIAGNOSIS:  Active Problems:   Acute respiratory failure ([place])   SECONDARY DIAGNOSIS:   Past Medical History:  Diagnosis Date  . A-fib ([place])   . Cancer ([place])   . Hypertension   . MI (myocardial infarction) [facility])     HOSPITAL COURSE:  *Acute hypoxic respiratory failure  Secondary to diastolic congestive heart failure exacerbation  Resolved   *Acute on chronic diastolic congestive heart failure exacerbation  Resolved  Treated on her congestive heart failure protocol, provide IV Lasix, supplemental oxygen, patient did well   *Chronic A. Fib Stable on beta-blocker, aspirin  *Acute hypokalemia Repleted  DISCHARGE CONDITIONS:  Stable, discharged home with home health PT, follow-up primary care provider and cardiologist directed   CONSULTS OBTAINED:  Treatment Team:  Isaias Cowman, MD Salary, Avel Peace, MD  DRUG ALLERGIES:   Allergies  Allergen Reactions  . Demerol [Meperidine]   . Other Other (See Comments)  . Propoxyphene     DISCHARGE MEDICATIONS:   Allergies as of 07/28/2017      Reactions   Demerol [meperidine]    Other Other (See Comments)   Propoxyphene       Medication List    STOP taking these medications   chlorpheniramine-HYDROcodone 10-8 MG/5ML [place] Commonly known as:  TUSSIONEX PENNKINETIC ER   lisinopril 2.5 MG tablet Commonly known as:  PRINIVIL,ZESTRIL   ondansetron 4 MG disintegrating tablet Commonly known as:  ZOFRAN ODT     TAKE these medications   acetaminophen 325 MG tablet Commonly known as:  TYLENOL Take 650 mg by mouth  every 6 (six) hours as needed.   aspirin 81 MG EC tablet Take 81 mg by mouth daily.   atorvastatin 40 MG tablet Commonly known as:  LIPITOR Take 40 mg by mouth daily at 6 PM.   famotidine 20 MG tablet Commonly known as:  PEPCID Take 1 tablet (20 mg total) by mouth at bedtime.   ferrous sulfate 325 (65 FE) MG tablet Take 325 mg by mouth daily with breakfast.   furosemide 20 MG tablet Commonly known as:  LASIX Take 1 tablet (20 mg total) by mouth daily.   metoprolol tartrate 25 MG tablet Commonly known as:  LOPRESSOR Take 0.5 tablets (12.5 mg total) by mouth 2 (two) times daily.   nitroGLYCERIN 0.4 MG SL tablet Commonly known as:  NITROSTAT Place 0.4 mg under the tongue every 5 (five) minutes as needed for chest pain.   pantoprazole 40 MG tablet Commonly known as:  PROTONIX Take 40 mg by mouth daily.   Vitamin C 500 MG Caps Take 500 mg by mouth daily.   vitamin E 400 UNIT capsule Take 400 Units by mouth daily.        DISCHARGE INSTRUCTIONS:   If you experience worsening of your admission symptoms, develop shortness of breath, life threatening emergency, suicidal or homicidal thoughts you must seek medical attention immediately by calling 911 or calling your MD immediately  if symptoms less severe.  You Must read complete instructions/literature along with all the possible adverse reactions/side effects for all the Medicines you take and that have  been prescribed to you. Take any new Medicines after you have completely understood and accept all the possible adverse reactions/side effects.   Please note  You were cared for by a hospitalist during your hospital stay. If you have any questions about your discharge medications or the care you received while you were in the hospital after you are discharged, you can call the unit and asked to speak with the hospitalist on call if the hospitalist that took care of you is not available. Once you are discharged, your primary  care physician will handle any further medical issues. Please note that NO REFILLS for any discharge medications will be authorized once you are discharged, as it is imperative that you return to your primary care physician (or establish a relationship with a primary care physician if you do not have one) for your aftercare needs so that they can reassess your need for medications and monitor your lab values.    Today   CHIEF COMPLAINT:   Chief Complaint  Patient presents with  . Shortness of Breath    HISTORY OF PRESENT ILLNESS:  84 y.o. 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 Patient was brought to emergency room via EMS for respiratory distress.  Patient complains of shortness of breath and lower extremity swelling going on for the past 3 days, gradually getting worse.  Her symptoms are worse with exertion.  She denies any fever or chills, no chest pain or cough, no N/V/D, no bleeding.  She states she is compliant with medications. At the arrival to the emergency room, patient's oxygen saturation was 91% on room air.  Her RR was at 28.  Patient was offered BiPAP treatment but she refused.  She was placed on 4 L O2, per [place]. Blood test done in the emergency room, revealed low hemoglobin level at 8.7.  Most recent hemoglobin level 1 year ago was, at 13.2.  Troponin level is 0.03, which is the baseline in this patient.  BNP is elevated at 872.  EKG, reviewed by myself shows atrial fibrillation with a heart rate of 82.  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  These changes are seen on prior EKGs.  No acute changes.  Chest x-ray, reviewed by myself, shows interstitial edema and small right pleural effusion. Patient is admitted for further evaluation and treatment. VITAL SIGNS:   Blood pressure 128/65, pulse 80, temperature 97.8 F (36.6 C), temperature source Oral, resp. rate 17, height 4\' 8"  (1.422 m), weight 50.8 kg (112 lb), SpO2 100 %.  I/O:    Intake/Output Summary (Last 24 hours) at 07/28/2017 1325 Last data filed at 07/28/2017 1014 Gross per 24 hour  Intake 603 ml  Output 1425 ml  Net -822 ml    PHYSICAL EXAMINATION:  GENERAL:  84 y.o.-year-old patient lying in the bed with no acute distress.  EYES: Pupils equal, round, reactive to light and accommodation. No scleral icterus. Extraocular muscles intact.  HEENT: Head atraumatic, normocephalic. Oropharynx and nasopharynx clear.  NECK:  Supple, no jugular venous distention. No thyroid enlargement, no tenderness.  LUNGS: Normal breath sounds bilaterally, no wheezing, rales,rhonchi or crepitation. No use of accessory muscles of respiration.  CARDIOVASCULAR: S1, S2 normal. No murmurs, rubs, or gallops.  ABDOMEN: Soft, non-tender, non-distended. Bowel sounds present. No organomegaly or mass.  EXTREMITIES: No pedal edema, cyanosis,  or clubbing.  NEUROLOGIC: Cranial nerves II through XII are intact. Muscle strength 5/5 in all extremities. Sensation intact. Gait not checked.  PSYCHIATRIC: The patient is alert and oriented x 3.  SKIN: No obvious rash, lesion, or ulcer.   DATA REVIEW:   CBC Recent Labs  Lab 07/27/17 0605  WBC 8.9  HGB 9.3*  HCT 29.5*  PLT 381    Chemistries  Recent Labs  Lab 07/23/17 2000  07/28/17 0843  NA 139   < > 137  K 3.6   < > 4.1  CL 103   < > 96*  CO2 27   < > 30  GLUCOSE 107*   < > 92  BUN 24*   < > 15  CREATININE 0.54   < > 0.44  CALCIUM 8.5*   < > 8.7*  AST 21  --   --   ALT 12*  --   --   ALKPHOS 74  --   --   BILITOT 0.6  --   --    < > = values in this interval not displayed.    Cardiac Enzymes Recent Labs  Lab 07/24/17 1252  TROPONINI <0.03    Microbiology Results  No results found for this or any previous visit.  RADIOLOGY:  No results  found.  EKG:   Orders placed or performed during the hospital encounter of 07/23/17  . ED EKG  . ED EKG  . EKG 12-Lead  . EKG 12-Lead      Management plans discussed with the patient, family and they are in agreement.  CODE STATUS:     Code Status Orders  (From admission, onward)        Start     Ordered   07/24/17 0144  Full code  Continuous     07/24/17 0143    Code Status History    Date Active Date Inactive Code Status Order ID Comments User Context   06/01/2016 0703 06/05/2016 2003 Full Code [id number]  Saundra Shelling, MD ED      TOTAL TIME TAKING CARE OF THIS PATIENT: 45 minutes.    Avel Peace Salary M.D on 07/28/2017 at 1:25 PM  Between 7am to 6pm - Pager - [phone]  After 6pm go to [email or web address] - password [email or web address] [facility]  Office  [phone]  CC: Primary care physician; Valera Castle, MD   Note: This dictation was prepared with Dragon dictation along with smaller phrase technology. Any transcriptional errors that result from this process are unintentional.

## 2017-07-28 NOTE — Care Management Note (Addendum)
Case Management Note  Patient Details  Name: Mallory Rios MRN: [id number] Date of Birth: Apr 23, 1927  Subjective/Objective:   Spoke with patient regarding home health. She would only allow a RN to come out, no other disciplines. Even the RN took much convincing by the [facility]. She has no agency preference. Referral to [facility] for [facility]. Will initiate CHF protocol through [facility].  PCP is Dr. Kym Groom. Patient discharging today. Patient did not qualify for home O2. Dr. Jerelyn Charles updated POC.    Action/Plan:   Expected Discharge Date:  07/28/17               Expected Discharge Plan:  [place]  In-House Referral:     Discharge planning Services  CM Consult  Post Acute Care Choice:  Home Health Choice offered to:  Patient  DME Arranged:    DME Agency:     HH Arranged:  RN [place] Agency:  [place]  Status of Service:  Completed, signed off  If discussed at [place] of Stay Meetings, dates discussed:    Additional Comments:  Jolly Mango, RN 07/28/2017, 3:07 PM

## 2017-07-28 NOTE — Care Management Important Message (Signed)
Copy of signed IM left in patient's room.    

## 2017-07-28 NOTE — Progress Notes (Addendum)
Cardiovascular and Pulmonary Nurse Navigator Note  84 year old female with known hx of diastolic HF, chronic atrial fibrillation, CAD s/p MI in the past, chronic mitral valve insufficiency, cancer, and HTN.  Patient was brought to ER via EMS for SOB and lower extremity edema going on for greater than 3 days.  Patient admitted with acute respiratory failure secondary to acute on chronic diastolic heart failure. BNP on admission 872.   Patient hypoxic on RA on arrival to ED.  Echo performed on 07/24/2017 which revealed an EF of 55-60%.    ABG on RA obtained this a.m.    PH 7.49 pCO2 50 PO2 56 Bicarb 38.1 O2 Sat  91.1 %  CHF Education:   Reviewed the booklet "Living Better with Heart Failure" with patient.  Patient reported that she has one of these folders at home already.  Briefly reviewed definition of heart failure and signs and symptoms of an exacerbation.  Explained to patient that HF is a chronic illness which requires self-assessment / self-management along with help from the cardiologist/PCP/HF Clinic.? ?? *Reviewed importance of and reason behind checking weight daily in the AM, after using the bathroom, but before getting dressed.?Patient does not currently have scales,  Patient reports she has the means to purchase her own scales.  Patient reported to this RN that she knew she was getting in trouble a week to two weeks before she called EMS to bring her to the ER because her legs/ankles/feet were swollen, belly felt tight, and she was more SOB. This RN encouraged patient to contact PCP/Cardiologist before it gets to point of needing to contact EMS.   ? Reviewed the following information with patient:  *Discussed when to call the Dr= weight gain of >2-3lb overnight of 5lb in a week,  *Discussed yellow zone= call MD: weight gain of >2-3lb overnight of 5lb in a week, increased swelling, increased SOB when lying down, chest discomfort, dizziness, increased fatigue *Red Zone= call 911: struggle  to breath, fainting or near fainting, significant chest pain  ? *Reviewed low sodium diet-provided handout of recommended and not recommended foods. Reviewed reading labels with patient. Discussed fluid intake with patient as well. Fluid Restriction: Patient instructed to drink no more than 2000 ml per day. Demonstrated this amount by using the bedside water pitcher.  Patient lives with her husband.  Patient's husband prepares their breakfast and then  he goes out and buys their dinner. They eat two meals per day.   *Smoking Cessation - Patient is a NEVER smoker.    *Discussed the benefits of exercise. Encouraged patient to remain as active as possible.   ? *[facility]- Explained the role of the [facility]. ?Explained to patient that the HF Clinic does not replace her PCP/cardiologist, but is an additional resource to help her manage her HF and to keep her out of the hospital. Appointment in the [facility] is scheduled for 08/03/2017 at 10:20 a.m. ?NOTE:  Patient is requesting this RN to cancel [facility] appointment.  Patient had two appointments last March, both of which were cancelled.  This RN cancelled the appointment.  Patient prefers to follow up with her cardiologist, Dr. Bea Laura,      Patient's husband drives a truck and patient is unable to get up into the truck.  As a result, patient relies on her friend to drive her to her appointments.  Also, patient reported that a 10:20 a.m. ? Again, the 5 Steps to  Living Better with Heart Failure were reviewed with patient.   ?Patient thanked me for providing the above information. ? ? Roanna Epley, RN, BSN, [facility] Cardiovascular and Pulmonary Nurse Navigator

## 2017-07-28 NOTE — Progress Notes (Signed)
Patient given discharge instructions. Patient verbalized understanding with no further questions. Patient will be going home with home health and it has been set up by case manager. Patient going home in family vehicle. Patient being discharged stable.

## 2017-07-29 LAB — BLOOD GAS, ARTERIAL
Acid-Base Excess: 13.2 mmol/L — ABNORMAL HIGH (ref 0.0–2.0)
Allens test (pass/fail): POSITIVE — AB
Bicarbonate: 38.1 mmol/L — ABNORMAL HIGH (ref 20.0–28.0)
FIO2: 0.21
O2 Saturation: 91.1 %
PCO2 ART: 50 mmHg — AB (ref 32.0–48.0)
PH ART: 7.49 — AB (ref 7.350–7.450)
PO2 ART: 56 mmHg — AB (ref 83.0–108.0)
Patient temperature: 37

## 2017-08-01 ENCOUNTER — Telehealth: Payer: Self-pay

## 2017-08-01 NOTE — Care Management (Addendum)
Post discharge note entry 08/01/17 1130AM/EMMI callback: RNCM attempted to reach back to patient regarding yellow flag on last discharge orders not read.  I was not able to reach patient at home number or husband's cell.  I have sent message to [facility] with [facility] to have [facility] go over plan of care post hospitalization. Again at 1608: RNCM attempted to reach patient by phone after I let it ring several times, patient did answer. She said she didn't have any questions about her discharge.  I recently learned that [facility] had been trying to do start of care but husband kept delaying visit, so I asked her and she said "he don't think I need it".  I explained that it was for her not him and if I could talk with him.  She said "no, he is at the doctor".  She said she didn't want home health services. Corene Cornea with [facility] updated.

## 2017-08-03 ENCOUNTER — Ambulatory Visit: Payer: Medicare Other | Admitting: Family

## 2017-08-05 NOTE — Telephone Encounter (Signed)
Note to close encounter.  

## 2017-10-19 ENCOUNTER — Encounter: Payer: Self-pay | Admitting: Emergency Medicine

## 2017-10-19 ENCOUNTER — Emergency Department: Payer: Medicare Other

## 2017-10-19 ENCOUNTER — Inpatient Hospital Stay: Payer: Medicare Other

## 2017-10-19 ENCOUNTER — Other Ambulatory Visit: Payer: Self-pay

## 2017-10-19 ENCOUNTER — Inpatient Hospital Stay
Admit: 2017-10-19 | Discharge: 2017-10-19 | Disposition: A | Discharge status: Home / Self Care | Payer: Medicare Other | Attending: Internal Medicine | Admitting: Internal Medicine

## 2017-10-19 ENCOUNTER — Inpatient Hospital Stay
Admission: EM | Admit: 2017-10-19 | Discharge: 2017-10-27 | DRG: 270 | Disposition: E | Payer: Medicare Other | Attending: Internal Medicine | Admitting: Internal Medicine

## 2017-10-19 DIAGNOSIS — I482 Chronic atrial fibrillation: Secondary | ICD-10-CM | POA: Diagnosis present

## 2017-10-19 DIAGNOSIS — E872 Acidosis: Secondary | ICD-10-CM | POA: Diagnosis not present

## 2017-10-19 DIAGNOSIS — Z79899 Other long term (current) drug therapy: Secondary | ICD-10-CM

## 2017-10-19 DIAGNOSIS — Z85118 Personal history of other malignant neoplasm of bronchus and lung: Secondary | ICD-10-CM

## 2017-10-19 DIAGNOSIS — I5033 Acute on chronic diastolic (congestive) heart failure: Secondary | ICD-10-CM | POA: Diagnosis present

## 2017-10-19 DIAGNOSIS — Z7982 Long term (current) use of aspirin: Secondary | ICD-10-CM | POA: Diagnosis not present

## 2017-10-19 DIAGNOSIS — Z66 Do not resuscitate: Secondary | ICD-10-CM | POA: Diagnosis present

## 2017-10-19 DIAGNOSIS — Z7189 Other specified counseling: Secondary | ICD-10-CM

## 2017-10-19 DIAGNOSIS — Z515 Encounter for palliative care: Secondary | ICD-10-CM

## 2017-10-19 DIAGNOSIS — R079 Chest pain, unspecified: Secondary | ICD-10-CM | POA: Diagnosis not present

## 2017-10-19 DIAGNOSIS — J9602 Acute respiratory failure with hypercapnia: Secondary | ICD-10-CM | POA: Diagnosis present

## 2017-10-19 DIAGNOSIS — I252 Old myocardial infarction: Secondary | ICD-10-CM | POA: Diagnosis not present

## 2017-10-19 DIAGNOSIS — J918 Pleural effusion in other conditions classified elsewhere: Secondary | ICD-10-CM | POA: Diagnosis present

## 2017-10-19 DIAGNOSIS — I214 Non-ST elevation (NSTEMI) myocardial infarction: Secondary | ICD-10-CM | POA: Diagnosis present

## 2017-10-19 DIAGNOSIS — R001 Bradycardia, unspecified: Secondary | ICD-10-CM | POA: Diagnosis not present

## 2017-10-19 DIAGNOSIS — D509 Iron deficiency anemia, unspecified: Secondary | ICD-10-CM | POA: Diagnosis present

## 2017-10-19 DIAGNOSIS — I70211 Atherosclerosis of native arteries of extremities with intermittent claudication, right leg: Secondary | ICD-10-CM | POA: Diagnosis not present

## 2017-10-19 DIAGNOSIS — Z885 Allergy status to narcotic agent status: Secondary | ICD-10-CM

## 2017-10-19 DIAGNOSIS — N179 Acute kidney failure, unspecified: Secondary | ICD-10-CM | POA: Diagnosis not present

## 2017-10-19 DIAGNOSIS — J9601 Acute respiratory failure with hypoxia: Secondary | ICD-10-CM

## 2017-10-19 DIAGNOSIS — I700 Atherosclerosis of aorta: Secondary | ICD-10-CM | POA: Diagnosis present

## 2017-10-19 DIAGNOSIS — I998 Other disorder of circulatory system: Secondary | ICD-10-CM | POA: Diagnosis present

## 2017-10-19 DIAGNOSIS — Z955 Presence of coronary angioplasty implant and graft: Secondary | ICD-10-CM

## 2017-10-19 DIAGNOSIS — M79604 Pain in right leg: Secondary | ICD-10-CM | POA: Diagnosis not present

## 2017-10-19 DIAGNOSIS — I2510 Atherosclerotic heart disease of native coronary artery without angina pectoris: Secondary | ICD-10-CM | POA: Diagnosis present

## 2017-10-19 DIAGNOSIS — I110 Hypertensive heart disease with heart failure: Secondary | ICD-10-CM | POA: Diagnosis present

## 2017-10-19 DIAGNOSIS — J90 Pleural effusion, not elsewhere classified: Secondary | ICD-10-CM

## 2017-10-19 DIAGNOSIS — R0602 Shortness of breath: Secondary | ICD-10-CM | POA: Diagnosis not present

## 2017-10-19 DIAGNOSIS — Z9889 Other specified postprocedural states: Secondary | ICD-10-CM

## 2017-10-19 DIAGNOSIS — J9811 Atelectasis: Secondary | ICD-10-CM | POA: Diagnosis present

## 2017-10-19 DIAGNOSIS — I70201 Unspecified atherosclerosis of native arteries of extremities, right leg: Secondary | ICD-10-CM | POA: Diagnosis present

## 2017-10-19 DIAGNOSIS — J189 Pneumonia, unspecified organism: Secondary | ICD-10-CM | POA: Diagnosis present

## 2017-10-19 DIAGNOSIS — R0600 Dyspnea, unspecified: Secondary | ICD-10-CM

## 2017-10-19 DIAGNOSIS — E875 Hyperkalemia: Secondary | ICD-10-CM | POA: Diagnosis not present

## 2017-10-19 DIAGNOSIS — I4891 Unspecified atrial fibrillation: Secondary | ICD-10-CM | POA: Diagnosis not present

## 2017-10-19 DIAGNOSIS — J9621 Acute and chronic respiratory failure with hypoxia: Secondary | ICD-10-CM | POA: Diagnosis not present

## 2017-10-19 DIAGNOSIS — R410 Disorientation, unspecified: Secondary | ICD-10-CM | POA: Diagnosis not present

## 2017-10-19 HISTORY — DX: Heart failure, unspecified: I50.9

## 2017-10-19 LAB — BODY FLUID CELL COUNT WITH DIFFERENTIAL
Eos, Fluid: 0 %
Lymphs, Fluid: 21 %
Monocyte-Macrophage-Serous Fluid: 24 %
NEUTROPHIL FLUID: 54 %
OTHER CELLS FL: 1 %
Total Nucleated Cell Count, Fluid: 30 cu mm

## 2017-10-19 LAB — BASIC METABOLIC PANEL
Anion gap: 11 (ref 5–15)
BUN: 30 mg/dL — AB (ref 8–23)
CALCIUM: 8.6 mg/dL — AB (ref 8.9–10.3)
CO2: 30 mmol/L (ref 22–32)
Chloride: 100 mmol/L (ref 98–111)
Creatinine, Ser: 0.72 mg/dL (ref 0.44–1.00)
GFR calc Af Amer: >60 mL/min (ref >60)
GLUCOSE: 132 mg/dL — AB (ref 70–99)
Potassium: 4.3 mmol/L (ref 3.5–5.1)
Sodium: 141 mmol/L (ref 135–145)

## 2017-10-19 LAB — CBC WITH DIFFERENTIAL/PLATELET
Basophils Absolute: 0.1 10*3/uL (ref 0–0.1)
Basophils Relative: 1 %
EOS PCT: 0 %
Eosinophils Absolute: 0.1 10*3/uL (ref 0–0.7)
HEMATOCRIT: 27.7 % — AB (ref 35.0–47.0)
Hemoglobin: 8.6 g/dL — ABNORMAL LOW (ref 12.0–16.0)
LYMPHS PCT: 4 %
Lymphs Abs: 0.5 10*3/uL — ABNORMAL LOW (ref 1.0–3.6)
MCH: 22.1 pg — AB (ref 26.0–34.0)
MCHC: 30.9 g/dL — ABNORMAL LOW (ref 32.0–36.0)
MCV: 71.6 fL — ABNORMAL LOW (ref 80.0–100.0)
MONO ABS: 1.3 10*3/uL — AB (ref 0.2–0.9)
MONOS PCT: 11 %
NEUTROS ABS: 10 10*3/uL — AB (ref 1.4–6.5)
Neutrophils Relative %: 84 %
PLATELETS: 469 10*3/uL — AB (ref 150–440)
RBC: 3.87 MIL/uL (ref 3.80–5.20)
RDW: 23 % — AB (ref 11.5–14.5)
WBC: 11.9 10*3/uL — ABNORMAL HIGH (ref 3.6–11.0)

## 2017-10-19 LAB — LIPID PANEL
Cholesterol: 85 mg/dL (ref 0–200)
HDL: 25 mg/dL — AB (ref >40)
LDL Cholesterol: 50 mg/dL (ref 0–99)
Total CHOL/HDL Ratio: 3.4 RATIO
Triglycerides: 50 mg/dL (ref <150)
VLDL: 10 mg/dL (ref 0–40)

## 2017-10-19 LAB — APTT: APTT: 28 s (ref 24–36)

## 2017-10-19 LAB — TROPONIN I
TROPONIN I: 2.41 ng/mL — AB (ref <0.03)
TROPONIN I: 4.81 ng/mL — AB (ref <0.03)
Troponin I: 0.54 ng/mL (ref <0.03)
Troponin I: 0.97 ng/mL (ref <0.03)

## 2017-10-19 LAB — PROTIME-INR
INR: 1.21
PROTHROMBIN TIME: 15.2 s (ref 11.4–15.2)

## 2017-10-19 LAB — PATHOLOGIST SMEAR REVIEW

## 2017-10-19 LAB — HEPARIN LEVEL (UNFRACTIONATED): Heparin Unfractionated: <0.1 IU/mL — ABNORMAL LOW (ref 0.30–0.70)

## 2017-10-19 LAB — PROTEIN, PLEURAL OR PERITONEAL FLUID: Total protein, fluid: <3 g/dL

## 2017-10-19 LAB — BRAIN NATRIURETIC PEPTIDE: B Natriuretic Peptide: 899 pg/mL — ABNORMAL HIGH (ref 0.0–100.0)

## 2017-10-19 LAB — GLUCOSE, PLEURAL OR PERITONEAL FLUID: GLUCOSE FL: 128 mg/dL

## 2017-10-19 LAB — ALBUMIN, PLEURAL OR PERITONEAL FLUID: Albumin, Fluid: 1.4 g/dL

## 2017-10-19 LAB — LACTIC ACID, PLASMA: LACTIC ACID, VENOUS: 1.9 mmol/L (ref 0.5–1.9)

## 2017-10-19 MED ORDER — FAMOTIDINE 20 MG PO TABS
10.0000 mg | ORAL_TABLET | Freq: Two times a day (BID) | ORAL | Status: DC
  Administered 2017-10-19 – 2017-10-20 (×2): 10 mg via ORAL
  Filled 2017-10-19 (×2): qty 1

## 2017-10-19 MED ORDER — FUROSEMIDE 10 MG/ML IJ SOLN
20.0000 mg | Freq: Two times a day (BID) | INTRAMUSCULAR | Status: DC
  Administered 2017-10-19 – 2017-10-20 (×4): 20 mg via INTRAVENOUS
  Filled 2017-10-19: qty 2
  Filled 2017-10-19: qty 4
  Filled 2017-10-19 (×2): qty 2

## 2017-10-19 MED ORDER — HEPARIN SODIUM (PORCINE) 5000 UNIT/ML IJ SOLN: 5000.0000 [IU] | Freq: Three times a day (TID) | INTRAMUSCULAR | Status: DC

## 2017-10-19 MED ORDER — ACETAMINOPHEN 325 MG PO TABS
650.0000 mg | ORAL_TABLET | Freq: Four times a day (QID) | ORAL | Status: DC | PRN
  Administered 2017-10-19: 650 mg via ORAL
  Filled 2017-10-19: qty 2

## 2017-10-19 MED ORDER — DOCUSATE SODIUM 100 MG PO CAPS: 100.0000 mg | ORAL_CAPSULE | Freq: Every day | ORAL | Status: DC | PRN

## 2017-10-19 MED ORDER — IPRATROPIUM-ALBUTEROL 0.5-2.5 (3) MG/3ML IN SOLN
3.0000 mL | RESPIRATORY_TRACT | Status: DC | PRN
  Administered 2017-10-20: 3 mL via RESPIRATORY_TRACT

## 2017-10-19 MED ORDER — SODIUM CHLORIDE 0.9 % IV SOLN
2.0000 g | Freq: Once | INTRAVENOUS | Status: AC
  Administered 2017-10-19: 2 g via INTRAVENOUS
  Filled 2017-10-19: qty 2

## 2017-10-19 MED ORDER — HEPARIN BOLUS VIA INFUSION
1500.0000 [IU] | Freq: Once | INTRAVENOUS | Status: AC
  Administered 2017-10-19: 1500 [IU] via INTRAVENOUS
  Filled 2017-10-19: qty 1500

## 2017-10-19 MED ORDER — ASPIRIN EC 81 MG PO TBEC
81.0000 mg | DELAYED_RELEASE_TABLET | Freq: Every day | ORAL | Status: DC
  Administered 2017-10-19 – 2017-10-20 (×2): 81 mg via ORAL
  Filled 2017-10-19 (×2): qty 1

## 2017-10-19 MED ORDER — VANCOMYCIN HCL IN DEXTROSE 1-5 GM/200ML-% IV SOLN
1000.0000 mg | Freq: Once | INTRAVENOUS | Status: AC
  Administered 2017-10-19: 1000 mg via INTRAVENOUS
  Filled 2017-10-19: qty 200

## 2017-10-19 MED ORDER — FERROUS SULFATE 325 (65 FE) MG PO TABS
325.0000 mg | ORAL_TABLET | Freq: Every day | ORAL | Status: DC
  Administered 2017-10-21: 325 mg via ORAL
  Filled 2017-10-19 (×2): qty 1

## 2017-10-19 MED ORDER — VITAMIN C 500 MG PO TABS
500.0000 mg | ORAL_TABLET | Freq: Every day | ORAL | Status: DC
  Administered 2017-10-19 – 2017-10-20 (×2): 500 mg via ORAL
  Filled 2017-10-19 (×3): qty 1

## 2017-10-19 MED ORDER — FUROSEMIDE 10 MG/ML IJ SOLN
INTRAMUSCULAR | Status: AC
Start: 2017-10-19 — End: 2017-10-19
  Administered 2017-10-19: 40 mg via INTRAVENOUS
  Filled 2017-10-19: qty 4

## 2017-10-19 MED ORDER — VITAMIN E 180 MG (400 UNIT) PO CAPS
400.0000 [IU] | ORAL_CAPSULE | Freq: Every day | ORAL | Status: DC
  Administered 2017-10-19 – 2017-10-20 (×2): 400 [IU] via ORAL
  Filled 2017-10-19 (×3): qty 1

## 2017-10-19 MED ORDER — ATORVASTATIN CALCIUM 20 MG PO TABS
40.0000 mg | ORAL_TABLET | Freq: Every day | ORAL | Status: DC
  Administered 2017-10-19: 40 mg via ORAL
  Filled 2017-10-19 (×2): qty 2

## 2017-10-19 MED ORDER — METOPROLOL TARTRATE 25 MG PO TABS
12.5000 mg | ORAL_TABLET | Freq: Two times a day (BID) | ORAL | Status: DC
  Administered 2017-10-19 – 2017-10-20 (×2): 12.5 mg via ORAL
  Filled 2017-10-19 (×2): qty 1

## 2017-10-19 MED ORDER — DOCUSATE SODIUM 100 MG PO CAPS: 100.0000 mg | ORAL_CAPSULE | Freq: Two times a day (BID) | ORAL | Status: DC | PRN

## 2017-10-19 MED ORDER — FUROSEMIDE 10 MG/ML IJ SOLN
40.0000 mg | Freq: Once | INTRAMUSCULAR | Status: AC
  Administered 2017-10-19: 40 mg via INTRAVENOUS

## 2017-10-19 MED ORDER — VANCOMYCIN HCL IN DEXTROSE 750-5 MG/150ML-% IV SOLN
750.0000 mg | INTRAVENOUS | Status: DC
  Filled 2017-10-19: qty 150

## 2017-10-19 MED ORDER — MIRTAZAPINE 15 MG PO TABS
7.5000 mg | ORAL_TABLET | Freq: Every day | ORAL | Status: DC
  Administered 2017-10-19: 7.5 mg via ORAL
  Filled 2017-10-19: qty 1

## 2017-10-19 MED ORDER — HEPARIN (PORCINE) IN NACL 100-0.45 UNIT/ML-% IJ SOLN
1000.0000 [IU]/h | INTRAMUSCULAR | Status: DC
Start: 2017-10-19 — End: 2017-10-21
  Administered 2017-10-19 (×2): 600 [IU]/h via INTRAVENOUS
  Administered 2017-10-20 (×2): 1000 [IU]/h via INTRAVENOUS
  Filled 2017-10-19 (×2): qty 250

## 2017-10-19 MED ORDER — SODIUM CHLORIDE 0.9 % IV SOLN
1.0000 g | INTRAVENOUS | Status: DC
  Filled 2017-10-19: qty 1

## 2017-10-19 MED ORDER — HEPARIN BOLUS VIA INFUSION
3000.0000 [IU] | Freq: Once | INTRAVENOUS | Status: AC
  Administered 2017-10-19: 3000 [IU] via INTRAVENOUS
  Filled 2017-10-19: qty 3000

## 2017-10-19 NOTE — ED Notes (Signed)
Janett Billow, RN given report.

## 2017-10-19 NOTE — ED Notes (Signed)
Patient tolerating Bipap well and appears more comfortable with less work of breathing.

## 2017-10-19 NOTE — ED Notes (Signed)
Sent urine to Lab

## 2017-10-19 NOTE — Progress Notes (Signed)
ANTICOAGULATION CONSULT NOTE - Initial Consult  Pharmacy Consult for heparin Indication: chest pain/ACS  Allergies  Allergen Reactions  . Demerol [Meperidine]   . Other Other (See Comments)  . Propoxyphene     Patient Measurements: Height: 5' (152.4 cm) Weight: 112 lb (50.8 kg) IBW/kg (Calculated) : 45.5 Heparin Dosing Weight: 51 kg  Vital Signs: Temp: 98.2 F (36.8 C) (07/24 0622) Temp Source: Oral (07/24 0622) BP: 148/86 (07/24 0730) Pulse Rate: 79 (07/24 0730)  Labs: Recent Labs    10/15/2017 0633  HGB 8.6*  HCT 27.7*  PLT 469*  CREATININE 0.72  TROPONINI 0.54*    Estimated Creatinine Clearance: 35.6 mL/min (by C-G formula based on SCr of 0.72 mg/dL).   Medical History: Past Medical History:  Diagnosis Date  . A-fib ([place])   . Cancer ([place])   . Hypertension   . MI (myocardial infarction) ([place])     Medications:  Scheduled:  . heparin  3,000 Units Intravenous Once    Assessment: Patient admitted s/t SOB and right-sided CP. EKG shows afib w/ ICVD placement Initial trop 0.54, patient is not on PTA anticoagulation. Being started on a heparin drip  Goal of Therapy:  Heparin level 0.3-0.7 units/ml Monitor platelets by anticoagulation protocol: Yes   Plan:  Will bolus w/ heparin 3000 units IV x 1 Will start rate at 600 units/hr  Will check anti-Xa @ 1600  Baseline labs drawn Will monitor daily CBC's and adjust per anti-Xa levels.  Tobie Lords, PharmD, BCPS Clinical Pharmacist 10/01/2017

## 2017-10-19 NOTE — ED Notes (Signed)
Repeat lactic acid discontinued per verbal order from Dr. Kerman Passey.

## 2017-10-19 NOTE — ED Notes (Signed)
Date and time results received: 09/27/2017 0728 Test: troponin Critical Value: 0.58 Name of Provider Notified: paduchowski Orders Received? Or Actions Taken?: none

## 2017-10-19 NOTE — Progress Notes (Signed)
CODE SEPSIS - PHARMACY COMMUNICATION  **Broad Spectrum Antibiotics should be administered within 1 hour of Sepsis diagnosis**  Time Code Sepsis Called/Page Received: 7169  Antibiotics Ordered: vanc/cefepime  Time of 1st antibiotic administration: 0807  Additional action taken by pharmacy: spoke to [facility] on status of abx, stated they were in the room about to be given.  If necessary, Name of Provider/Nurse Contacted:     Tobie Lords ,PharmD Clinical Pharmacist  10/13/2017  8:31 AM

## 2017-10-19 NOTE — Progress Notes (Signed)
Pt was transported to CT from ED and back while on bipap

## 2017-10-19 NOTE — ED Notes (Signed)
Pulmonologist at bedside. Aware of BP following fluid removal.

## 2017-10-19 NOTE — ED Notes (Signed)
Attempted to give report. RN states she will call back to accept report.

## 2017-10-19 NOTE — Progress Notes (Signed)
ANTICOAGULATION CONSULT NOTE - Initial Consult  Pharmacy Consult for heparin Indication: chest pain/ACS  Allergies  Allergen Reactions  . Demerol [Meperidine]   . Other Other (See Comments)  . Propoxyphene     Patient Measurements: Height: 4\' 8"  (142.2 cm) Weight: 114 lb 11.2 oz (52 kg) IBW/kg (Calculated) : 36.3 Heparin Dosing Weight: 51 kg  Vital Signs: Temp: 98.4 F (36.9 C) (07/24 1526) BP: 133/90 (07/24 1731) Pulse Rate: 81 (07/24 1731)  Labs: Recent Labs    10/02/2017 3295 09/29/2017 0742 10/04/2017 1031 09/30/2017 1437 10/09/2017 1600  HGB 8.6*  --   --   --   --   HCT 27.7*  --   --   --   --   PLT 469*  --   --   --   --   APTT  --  28  --   --   --   LABPROT  --  15.2  --   --   --   INR  --  1.21  --   --   --   HEPARINUNFRC  --   --   --   --  <0.10*  CREATININE 0.72  --   --   --   --   TROPONINI 0.54*  --  0.97* 2.41*  --     Estimated Creatinine Clearance: 33.3 mL/min (by C-G formula based on SCr of 0.72 mg/dL).   Medical History: Past Medical History:  Diagnosis Date  . A-fib ([place])   . Cancer ([place])   . CHF (congestive heart failure) ([place])   . Hypertension   . MI (myocardial infarction) ([place])     Medications:  Scheduled:  . aspirin EC  81 mg Oral Daily  . atorvastatin  40 mg Oral q1800  . famotidine  10 mg Oral BID  . [START ON 09/26/2017] ferrous sulfate  325 mg Oral Q breakfast  . furosemide  20 mg Intravenous Q12H  . metoprolol tartrate  12.5 mg Oral BID  . mirtazapine  7.5 mg Oral QHS  . vitamin C  500 mg Oral Daily  . vitamin E  400 Units Oral Daily    Assessment: Patient admitted s/t SOB and right-sided CP. EKG shows afib w/ ICVD placement Initial trop 0.54, patient is not on PTA anticoagulation.  Heparin drip at 600 units/hr 7/24 16:00 Heparin level resulted at <0.10  Goal of Therapy:  Heparin level 0.3-0.7 units/ml Monitor platelets by anticoagulation protocol: Yes   Plan:  Will order Heparin 1500 units IV bolus and  increase drip rate to 800 units/hr. Will check next Heparin level in 8 hours. Daily CBC while on Heparin.  Paulina Fusi, PharmD, BCPS 10/03/2017 6:34 PM

## 2017-10-19 NOTE — Consult Note (Signed)
[facility] Consultation      Assessment and Plan:  Acute hypoxic respiratory failure. Large right-sided pleural effusion with compressive atelectasis. Cardiomegaly, [name], thus far results appear consistent with transudate of effusion, [name], cardiology consulted. - Patient may require repeat thoracentesis in the future if the patient's pleural fluid re-accumulates. -Continue to wean down/off oxygen as tolerated.   Date: 10/08/2017  MRN# [id number] Mallory Rios Feb 17, 1928  Referring Physician:   LOLAH Rios is a 84 y.o. old female seen in consultation for chief complaint of:    Chief Complaint  Patient presents with  . Shortness of Breath    HPI:   The patient is a 83 year old female.  She presented to the ED this morning from [facility] where she has been staying for rehab after a pneumonia hospitalization.  She was discharged on 07/28/2017, initially admitted on 07/23/2017 for acute pulmonary edema with acute respiratory failure. [facility] developed worsening dyspnea, she had a chest x-ray which showed fluid, her Lasix was increased without any significant improvement.  She was also noted to have intermittent right-sided chest discomfort She underwent CT chest, images personally reviewed, there is a large right-sided pleural effusion, with compressive atelectasis, cardiomegaly, left-sided pulmonary edema with a small left effusion. She underwent a right thoracentesis with drainage of 1.2 L by interventional radiology today (10/13/2017).  Fluid studies shows a pleural fluid and albumin of 1.4, protein of less than 3.  Other fluid studies are currently pending.  Overall this test thus far appear to be consistent with transudate of effusion, likely from heart failure. Patient was initially placed on BiPAP, subsequently after  thoracentesis shows been weaned down to nasal cannula, and then down to room air.   PMHX:   Past Medical History:  Diagnosis Date  . A-fib ([place])   . Cancer ([place])   . CHF (congestive heart failure) ([place])   . Hypertension   . MI (myocardial infarction) [facility])    Surgical Hx:  Past Surgical History:  Procedure Laterality Date  . ABDOMINAL HYSTERECTOMY    . CARDIAC SURGERY     stents x6   Family Hx:  Family History  Problem Relation Age of Onset  . CAD Neg Hx   . Diabetes Neg Hx   . Hypertension Neg Hx    Social Hx:   Social History   Tobacco Use  . Smoking status: Never Smoker  . Smokeless tobacco: Never Used  Substance Use Topics  . Alcohol use: No  . Drug use: No   Medication:    Current Facility-Administered Medications:  .  [START ON 10/05/2017] ceFEPIme (MAXIPIME) 1 g in sodium chloride 0.9 % 100 mL IVPB, 1 g, Intravenous, Q24H, Vaughan Basta, MD .  furosemide (LASIX) injection 20 mg, 20 mg, Intravenous, Q12H, Vaughan Basta, MD, 20 mg at 09/29/2017 1032 .  heparin ADULT infusion 100 units/mL (25000 units/269mL sodium chloride 0.45%), 600 Units/hr, Intravenous, Continuous, Gregor Hams, MD, Last Rate: 6 mL/hr at 09/27/2017 0855, 600 Units/hr at 10/17/2017 0855 .  [START ON 10/02/2017] vancomycin (VANCOCIN) IVPB 750 mg/150 ml premix, 750 mg, Intravenous, Q24H, Vaughan Basta, MD  Current Outpatient Medications:  .  Ascorbic Acid (VITAMIN C) 500 MG CAPS, Take 500 mg by mouth daily., Disp: , Rfl:  .  aspirin 81 MG EC tablet, Take 81 mg by mouth daily., Disp: , Rfl:  .  atorvastatin (LIPITOR)  40 MG tablet, Take 40 mg by mouth daily at 6 PM. , Disp: , Rfl:  .  docusate sodium (COLACE) 100 MG capsule, Take 100 mg by mouth daily as needed for mild constipation., Disp: , Rfl:  .  ferrous sulfate 325 (65 FE) MG tablet, Take 325 mg by mouth daily with breakfast., Disp: , Rfl:  .  furosemide (LASIX) 20 MG tablet, Take 1 tablet (20 mg total) by mouth  daily. (Patient taking differently: Take 20 mg by mouth 2 (two) times daily. ), Disp: 3 tablet, Rfl: 0 .  ipratropium-albuterol (DUONEB) 0.5-2.5 (3) MG/3ML SOLN, Take 3 mLs by nebulization every 4 (four) hours as needed., Disp: , Rfl:  .  metoprolol tartrate (LOPRESSOR) 25 MG tablet, Take 0.5 tablets (12.5 mg total) by mouth 2 (two) times daily., Disp: 60 tablet, Rfl: 0 .  mirtazapine (REMERON) 7.5 MG tablet, Take 7.5 mg by mouth at bedtime., Disp: , Rfl:  .  ranitidine (ZANTAC) 75 MG tablet, Take 75 mg by mouth 2 (two) times daily., Disp: , Rfl:  .  vitamin E 400 UNIT capsule, Take 400 Units by mouth daily., Disp: , Rfl:  .  acetaminophen (TYLENOL) 325 MG tablet, Take 650 mg by mouth every 6 (six) hours as needed., Disp: , Rfl:  .  famotidine (PEPCID) 20 MG tablet, Take 1 tablet (20 mg total) by mouth at bedtime. (Patient not taking: Reported on 10/14/2017), Disp: 30 tablet, Rfl: 0 .  nitroGLYCERIN (NITROSTAT) 0.4 MG SL tablet, Place 0.4 mg under the tongue every 5 (five) minutes as needed for chest pain., Disp: , Rfl:    Allergies:  Demerol [meperidine]; Other; and Propoxyphene  Review of Systems: Gen:  Denies  fever, sweats, chills HEENT: Denies blurred vision, double vision. bleeds, sore throat Cvc:  No dizziness, chest pain. Resp:   Denies cough or sputum production, shortness of breath Gi: Denies swallowing difficulty, stomach pain. Gu:  Denies bladder incontinence, burning urine Ext:   No Joint pain, stiffness. Skin: No skin rash,  hives  Endoc:  No polyuria, polydipsia. Psych: No depression, insomnia. Other:  All other systems were reviewed with the patient and were negative other that what is mentioned in the HPI.   Physical Examination:   VS: BP 90/65 (BP Location: Left Arm)   Pulse 77   Temp 98.2 F (36.8 C) (Oral)   Resp 20   Ht 5' (1.524 m)   Wt 112 lb (50.8 kg)   SpO2 98%   BMI 21.87 kg/m   General Appearance: No distress  Neuro:without focal findings,  speech  normal,  HEENT: PERRLA, EOM intact.   Pulmonary: normal breath sounds, No wheezing.  CardiovascularNormal S1,S2.  No m/r/g.   Abdomen: Benign, Soft, non-tender. Renal:  No costovertebral tenderness  GU:  No performed at this time. Endoc: No evident thyromegaly, no signs of acromegaly. Skin:   warm, no rashes, no ecchymosis  Extremities: normal, no cyanosis, clubbing.  Other findings:    LABORATORY PANEL:   CBC Recent Labs  Lab 10/26/2017 0633  WBC 11.9*  HGB 8.6*  HCT 27.7*  PLT 469*   ------------------------------------------------------------------------------------------------------------------  Chemistries  Recent Labs  Lab 10/13/2017 0633  NA 141  K 4.3  CL 100  CO2 30  GLUCOSE 132*  BUN 30*  CREATININE 0.72  CALCIUM 8.6*   ------------------------------------------------------------------------------------------------------------------  Cardiac Enzymes Recent Labs  Lab 09/30/2017 1031  TROPONINI 0.97*   ------------------------------------------------------------  RADIOLOGY:  Dg Chest 1 View  Result Date: 10/01/2017 CLINICAL DATA:  Status post thoracentesis EXAM: CHEST  1 VIEW COMPARISON:  Chest radiograph and chest CT October 19, 2017 FINDINGS: There is no appreciable pneumothorax. There is only a small amount of residual pleural effusion following thoracentesis. There is atelectatic change in the right base. On the left, there is a small effusion with left base atelectasis. Heart is enlarged with pulmonary vascularity normal. No adenopathy. There is aortic atherosclerosis. Bones are osteoporotic. IMPRESSION: No pneumothorax. Small residual right pleural effusion after thoracentesis. Right base atelectasis. There is a slightly larger left pleural effusion, stable, with left base atelectasis. Stable cardiomegaly. There is aortic atherosclerosis. Aortic Atherosclerosis (ICD10-I70.0). Electronically Signed   By: Lowella Grip III M.D.   On: 10/18/2017 10:24   Ct  Chest Wo Contrast  Result Date: 09/28/2017 CLINICAL DATA:  Shortness of breath, pleural effusion, pneumonia EXAM: CT CHEST WITHOUT CONTRAST TECHNIQUE: Multidetector CT imaging of the chest was performed following the standard protocol without IV contrast. COMPARISON:  Chest x-ray earlier today FINDINGS: Cardiovascular: Diffuse aortic and coronary artery calcifications. No evidence of aortic aneurysm. Cardiomegaly. Mediastinum/Nodes: Scattered small and borderline sized mediastinal lymph nodes. No adenopathy. Moderate-sized hiatal hernia. Lungs/Pleura: Large right pleural effusion and small left pleural effusion. Compressive atelectasis in the left lower lobe. Airspace disease within the right lower lobe likely reflects compressive atelectasis although pneumonia cannot be completely excluded. Upper Abdomen: Imaging into the upper abdomen shows no acute findings. Heavily calcified abdominal aorta. Musculoskeletal: Chest wall soft tissues are unremarkable. No acute bony abnormality. IMPRESSION: Large right pleural effusion and small left pleural effusion. Probable compressive atelectasis in both lower lobes, right worse than left. Pneumonia cannot be completely excluded in the right lower lobe. Cardiomegaly.  Heavily calcified coronary arteries and aorta. Electronically Signed   By: Rolm Baptise M.D.   On: 10/15/2017 07:32   Dg Chest Port 1 View  Result Date: 10/09/2017 CLINICAL DATA:  Respiratory distress. EXAM: PORTABLE CHEST 1 VIEW COMPARISON:  07/23/2017 FINDINGS: Cardiac enlargement. Moderate to large right pleural effusion with basilar consolidation. This may indicate pneumonia. Left lung is clear. No pneumothorax. Calcification of the aorta. Mediastinal contours appear intact. IMPRESSION: Moderate to large right pleural effusion with consolidation in the right lung base possibly representing pneumonia. Cardiac enlargement. Electronically Signed   By: Lucienne Capers M.D.   On: 10/20/2017 06:37   US  Thoracentesis Asp Pleural Space W/img Guide  Result Date: 10/25/2017 INDICATION: Right pleural effusion EXAM: ULTRASOUND GUIDED RIGHT THORACENTESIS MEDICATIONS: None. COMPLICATIONS: None immediate. PROCEDURE: An ultrasound guided thoracentesis was thoroughly discussed with the patient and questions answered. The benefits, risks, alternatives and complications were also discussed. The patient understands and wishes to proceed with the procedure. Written consent was obtained. Ultrasound was performed to localize and mark an adequate pocket of fluid in the right chest. The area was then prepped and draped in the normal sterile fashion. 1% Lidocaine was used for local anesthesia. Under ultrasound guidance a 6 Fr Safe-T-Centesis catheter was introduced. Thoracentesis was performed. The catheter was removed and a dressing applied. FINDINGS: A total of approximately 1.2 L of clear yellow fluid was removed. Samples were sent to the laboratory as requested by the clinical team. IMPRESSION: Successful ultrasound guided right thoracentesis yielding 1.2 L of pleural fluid. Electronically Signed   By: Marybelle Killings M.D.   On: 10/01/2017 10:36       Thank  you for the consultation and for allowing [facility] to assist in the care of your patient. Our recommendations are  noted above.  Please contact us if we can be of further service.   Marda Stalker, M.D., F.C.C.P.  Board Certified in Internal Medicine, Pulmonary Medicine, [place], and Sleep Medicine.  [facility] Office Number: [phone]   10/16/2017

## 2017-10-19 NOTE — ED Provider Notes (Addendum)
[facility] Emergency Department Provider Note   First MD Initiated Contact with Patient 10/13/2017 [phone]     (approximate)  I have reviewed the triage vital signs and the nursing notes.   HISTORY  Chief Complaint Shortness of Breath    HPI Mallory Rios is a 84 y.o. female below list of chronic medical conditions including CHF, MI and recent hospitalization secondary to pneumonia presents to the emergency department from [facility] secondary to dyspnea, respiratory distress.  Patient went to [facility] 10 days ago following hospital admission for pneumonia.  Patient states that the past 3 days her breathing has progressively worsened.  Patient also admits to intermittent right-sided chest discomfort tonight for which she received 1 sublingual nitroglycerin which patient states did help "a little".   Past Medical History:  Diagnosis Date  . A-fib ([place])   . Cancer ([place])   . Hypertension   . MI (myocardial infarction) [facility])     Patient Active Problem List   Diagnosis Date Noted  . Acute respiratory failure ([place]) 07/23/2017  . CHF (congestive heart failure) ([place]) 06/01/2016    Past Surgical History:  Procedure Laterality Date  . ABDOMINAL HYSTERECTOMY    . CARDIAC SURGERY     stents x6    Prior to Admission medications   Medication Sig Start Date End Date Taking? Authorizing Provider  acetaminophen (TYLENOL) 325 MG tablet Take 650 mg by mouth every 6 (six) hours as needed.    [provider]  Ascorbic Acid (VITAMIN C) 500 MG CAPS Take 500 mg by mouth daily.    [provider]  aspirin 81 MG EC tablet Take 81 mg by mouth daily. 09/09/09   [provider]  atorvastatin (LIPITOR) 40 MG tablet Take 40 mg by mouth daily at 6 PM.  02/11/16   [provider]  famotidine (PEPCID) 20 MG tablet Take 1 tablet (20 mg total) by mouth at bedtime. 06/05/16   Fritzi Mandes, MD  ferrous sulfate 325 (65 FE) MG tablet Take  325 mg by mouth daily with breakfast.    [provider]  furosemide (LASIX) 20 MG tablet Take 1 tablet (20 mg total) by mouth daily. 03/11/16 07/23/21  Nance Pear, MD  metoprolol tartrate (LOPRESSOR) 25 MG tablet Take 0.5 tablets (12.5 mg total) by mouth 2 (two) times daily. 06/05/16   Fritzi Mandes, MD  nitroGLYCERIN (NITROSTAT) 0.4 MG SL tablet Place 0.4 mg under the tongue every 5 (five) minutes as needed for chest pain.    [provider]  pantoprazole (PROTONIX) 40 MG tablet Take 40 mg by mouth daily.  02/11/16   [provider]  vitamin E 400 UNIT capsule Take 400 Units by mouth daily.    [provider]    Allergies Demerol [meperidine]; Other; and Propoxyphene  Family History  Problem Relation Age of Onset  . CAD Neg Hx   . Diabetes Neg Hx   . Hypertension Neg Hx     Social History Social History   Tobacco Use  . Smoking status: Never Smoker  . Smokeless tobacco: Never Used  Substance Use Topics  . Alcohol use: No  . Drug use: No    Review of Systems Constitutional: No fever/chills Eyes: No visual changes. ENT: No sore throat. Cardiovascular: Positive for chest pain. Respiratory: Positive for shortness of breath. Gastrointestinal: No abdominal pain.  No nausea, no vomiting.  No diarrhea.  No constipation. Genitourinary: Negative for dysuria. Musculoskeletal: Negative for neck pain.  Negative for back pain. Integumentary: Negative for rash. Neurological: Negative for headaches, focal weakness or numbness.   ____________________________________________   PHYSICAL EXAM:  VITAL SIGNS: ED Triage Vitals  Enc Vitals Group     BP --      Pulse Rate 10/01/2017 0622 85     Resp 10/20/2017 0622 (!) 31     Temp 09/30/2017 0622 98.2 F (36.8 C)     Temp Source 10/10/2017 0622 Oral     SpO2 10/14/2017 0613 95 %     Weight 10/14/2017 0624 50.8 kg (112 lb)     Height 10/15/2017 0624 1.524 m (5')     Head Circumference --      [facility] --        Pain Score 10/18/2017 0623 7     Pain Loc --      Pain Edu? --      Excl. in [place]? --     Constitutional: Alert and oriented.  Apparent respiratory distress  eyes: Conjunctivae are normal. Head: Atraumatic. Mouth/Throat: Mucous membranes are moist.  Oropharynx non-erythematous. Neck: No stridor.   Cardiovascular: Normal rate, regular rhythm. Good peripheral circulation. Grossly normal heart sounds. Respiratory: Tachypnea, positive accessory respiratory muscle use, bibasilar rales right worse than left Gastrointestinal: Soft and nontender. No distention.   Musculoskeletal: No lower extremity tenderness nor edema. No gross deformities of extremities. Neurologic:  Normal speech and language. No gross focal neurologic deficits are appreciated.  Skin:  Skin is warm, dry and intact. No rash noted. Psychiatric: Mood and affect are normal. Speech and behavior are normal.  ____________________________________________   LABS (all labs ordered are listed, but only abnormal results are displayed)  Labs Reviewed  CBC WITH DIFFERENTIAL/PLATELET - Abnormal; Notable for the following components:      Result Value   WBC 11.9 (*)    Hemoglobin 8.6 (*)    HCT 27.7 (*)    MCV 71.6 (*)    MCH 22.1 (*)    MCHC 30.9 (*)    RDW 23.0 (*)    Platelets 469 (*)    Neutro Abs 10.0 (*)    Lymphs Abs 0.5 (*)    Monocytes Absolute 1.3 (*)    All other components within normal limits  CULTURE, BLOOD (ROUTINE X 2)  CULTURE, BLOOD (ROUTINE X 2)  BASIC METABOLIC PANEL  BRAIN NATRIURETIC PEPTIDE  TROPONIN I  LACTIC ACID, PLASMA  LACTIC ACID, PLASMA   ____________________________________________  EKG ED ECG REPORT I, [place] N Marlea Gambill, the attending physician, personally viewed and interpreted this ECG.   Date: 10/22/2017  EKG Time: 6:16 AM  Rate: 76  Rhythm: Atrial fibrillation  Axis: Normal  Intervals: Irregular RR interval  ST&T Change: None, possible J-point deflection inferior lateral  leads   ____________________________________________  RADIOLOGY I, [place] N Ali Mohl, personally viewed and evaluated these images (plain radiographs) as part of my medical decision making, as well as reviewing the written report by the radiologist.  ED MD interpretation: Moderate to large right pleural effusion with consolidation in the right lung base possibly representing pneumonia per radiologist  Official radiology report(s): Dg Chest Port 1 View  Result Date: 10/11/2017 CLINICAL DATA:  Respiratory distress. EXAM: PORTABLE CHEST 1 VIEW COMPARISON:  07/23/2017 FINDINGS: Cardiac enlargement. Moderate to large right pleural effusion with basilar consolidation. This may indicate pneumonia. Left lung is clear. No pneumothorax. Calcification of the aorta. Mediastinal contours appear intact. IMPRESSION: Moderate to large right pleural effusion with consolidation in the right lung base possibly  representing pneumonia. Cardiac enlargement. Electronically Signed   By: Lucienne Capers M.D.   On: 09/29/2017 06:37    ____________________________________________   PROCEDURES  Critical Care performed:   .Critical Care Performed by: Gregor Hams, MD Authorized by: Gregor Hams, MD   Critical care provider statement:    Critical care time (minutes):  45   Critical care time was exclusive of:  Separately billable procedures and treating other patients and teaching time   Critical care was time spent personally by me on the following activities:  Development of treatment plan with patient or surrogate, discussions with consultants, evaluation of patient's response to treatment, examination of patient, obtaining history from patient or surrogate, ordering and performing treatments and interventions, ordering and review of laboratory studies, ordering and review of radiographic studies, pulse oximetry, re-evaluation of patient's condition and review of old charts   I assumed direction of  critical care for this patient from another provider in my specialty: no       ____________________________________________   INITIAL IMPRESSION / ASSESSMENT AND PLAN / ED COURSE  As part of my medical decision making, I reviewed the following data within the electronic MEDICAL RECORD [id number] year old female presenting with above-stated history and physical exam secondary to respiratory distress.  BiPAP applied to the patient shortly after arrival to the emergency department additional DuoNeb administered.  Patient received 40 mg of IV Lasix as well.  Given concern for possible persistent pneumonia versus pleural effusion CT scan of the chest will be performed.  Patient discussed with Dr. Anselm Jungling for hospital admission further evaluation and management. ____________________________________________  FINAL CLINICAL IMPRESSION(S) / ED DIAGNOSES  Final diagnoses:  Acute hypoxemic respiratory failure (HCC)  Pleural effusion     MEDICATIONS GIVEN DURING THIS VISIT:  Medications  ceFEPIme (MAXIPIME) 2 g in sodium chloride 0.9 % 100 mL IVPB (has no administration in time range)  vancomycin (VANCOCIN) IVPB 1000 mg/200 mL premix (has no administration in time range)  furosemide (LASIX) injection 40 mg (40 mg Intravenous Given 10/18/2017 0615)     ED Discharge Orders    None       Note:  This document was prepared using Dragon voice recognition software and may include unintentional dictation errors.    Gregor Hams, MD 09/28/2017 [id number]    Gregor Hams, MD 10/28/17 2240

## 2017-10-19 NOTE — H&P (Signed)
[facility] NAME: Mallory Rios    MR#:  [id number]  DATE OF BIRTH:  31-Jan-1928  DATE OF ADMISSION:  10/11/2017  PRIMARY CARE PHYSICIAN: Juluis Pitch, MD   REQUESTING/REFERRING PHYSICIAN: [email or web address]  CHIEF COMPLAINT:   Chief Complaint  Patient presents with  . Shortness of Breath    HISTORY OF PRESENT ILLNESS: Mallary Rios  is a 84 y.o. female with a known history of A. fib, CHF, hypertension, myocardial infarction, recent admission to [facility] 2 weeks ago for pneumonia and anasarca-[facility].  She was more short of breath worsening for the last few days, so was sent back to emergency room. Noted to be hypoxic and pneumonia on chest x-ray so started on BiPAP and hospitalist service was called for admission. On CT scan of the chest she was noted to have large right-sided pleural effusion and patient felt slightly better after using BiPAP for 1 to 2 hours and was able to come on nasal cannula oxygen. Patient was lethargic to confused and could not give much detailed.  PAST MEDICAL HISTORY:   Past Medical History:  Diagnosis Date  . A-fib ([place])   . Cancer ([place])   . CHF (congestive heart failure) ([place])   . Hypertension   . MI (myocardial infarction) ([place])     PAST SURGICAL HISTORY:  Past Surgical History:  Procedure Laterality Date  . ABDOMINAL HYSTERECTOMY    . CARDIAC SURGERY     stents x6    SOCIAL HISTORY:  Social History   Tobacco Use  . Smoking status: Never Smoker  . Smokeless tobacco: Never Used  Substance Use Topics  . Alcohol use: No    FAMILY HISTORY:  Family History  Problem Relation Age of Onset  . CAD Neg Hx   . Diabetes Neg Hx   . Hypertension Neg Hx     DRUG ALLERGIES:  Allergies  Allergen Reactions  . Demerol [Meperidine]   . Other Other (See Comments)  . Propoxyphene     REVIEW OF SYSTEMS:   Pt is lethargic.,   MEDICATIONS AT HOME:   Prior to Admission medications   Medication Sig Start Date End Date Taking? Authorizing Provider  Ascorbic Acid (VITAMIN C) 500 MG CAPS Take 500 mg by mouth daily.   Yes [provider]  aspirin 81 MG EC tablet Take 81 mg by mouth daily. 09/09/09  Yes [provider]  atorvastatin (LIPITOR) 40 MG tablet Take 40 mg by mouth daily at 6 PM.  02/11/16  Yes [provider]  docusate sodium (COLACE) 100 MG capsule Take 100 mg by mouth daily as needed for mild constipation.   Yes [provider]  ferrous sulfate 325 (65 FE) MG tablet Take 325 mg by mouth daily with breakfast.   Yes [provider]  furosemide (LASIX) 20 MG tablet Take 1 tablet (20 mg total) by mouth daily. Patient taking differently: Take 20 mg by mouth 2 (two) times daily.  03/11/16 07/23/21 Yes Nance Pear, MD  ipratropium-albuterol (DUONEB) 0.5-2.5 (3) MG/3ML SOLN Take 3 mLs by nebulization every 4 (four) hours as needed.   Yes [provider]  metoprolol tartrate (LOPRESSOR) 25 MG tablet Take 0.5 tablets (12.5 mg total) by mouth 2 (two) times daily. 06/05/16  Yes Fritzi Mandes, MD  mirtazapine (REMERON) 7.5 MG tablet Take 7.5 mg by mouth at bedtime.   Yes [provider]  ranitidine (ZANTAC) 75 MG tablet  Take 75 mg by mouth 2 (two) times daily.   Yes [provider]  vitamin E 400 UNIT capsule Take 400 Units by mouth daily.   Yes [provider]  acetaminophen (TYLENOL) 325 MG tablet Take 650 mg by mouth every 6 (six) hours as needed.    [provider]  famotidine (PEPCID) 20 MG tablet Take 1 tablet (20 mg total) by mouth at bedtime. Patient not taking: Reported on 10/23/2017 06/05/16   Fritzi Mandes, MD  nitroGLYCERIN (NITROSTAT) 0.4 MG SL tablet Place 0.4 mg under the tongue every 5 (five) minutes as needed for chest pain.    [provider]      PHYSICAL EXAMINATION:   VITAL SIGNS: Blood pressure 128/85, pulse 72, temperature 98.4  F (36.9 C), resp. rate 18, height 4\' 8"  (1.422 m), weight 52 kg (114 lb 11.2 oz), SpO2 (!) 89 %.  GENERAL:  84 y.o.-year-old thin patient lying in the bed with no acute distress.  EYES: Pupils equal, round, reactive to light and accommodation. No scleral icterus. Extraocular muscles intact.  HEENT: Head atraumatic, normocephalic. Oropharynx and nasopharynx clear.  NECK:  Supple, no jugular venous distention. No thyroid enlargement, no tenderness.  LUNGS: Decreased sounds on the right, no wheezing, rales,rhonchi or crepitation. No use of accessory muscles of respiration.  On BiPAP.   CARDIOVASCULAR: S1, S2 normal. No murmurs, rubs, or gallops.  ABDOMEN: Soft, nontender, nondistended. Bowel sounds present. No organomegaly or mass.  EXTREMITIES: No pedal edema, cyanosis, or clubbing.  NEUROLOGIC: Lethargic but opens eyes to stimuli and moves limbs slowly. PSYCHIATRIC: The patient is lethargic. SKIN: No obvious rash, lesion, or ulcer.   LABORATORY PANEL:   CBC Recent Labs  Lab 10/22/2017 0633  WBC 11.9*  HGB 8.6*  HCT 27.7*  PLT 469*  MCV 71.6*  MCH 22.1*  MCHC 30.9*  RDW 23.0*  LYMPHSABS 0.5*  MONOABS 1.3*  EOSABS 0.1  BASOSABS 0.1   ------------------------------------------------------------------------------------------------------------------  Chemistries  Recent Labs  Lab 09/30/2017 0633  NA 141  K 4.3  CL 100  CO2 30  GLUCOSE 132*  BUN 30*  CREATININE 0.72  CALCIUM 8.6*   ------------------------------------------------------------------------------------------------------------------ estimated creatinine clearance is 33.3 mL/min (by C-G formula based on SCr of 0.72 mg/dL). ------------------------------------------------------------------------------------------------------------------ No results for input(s): TSH, T4TOTAL, T3FREE, THYROIDAB in the last 72 hours.  Invalid input(s): FREET3   Coagulation profile Recent Labs  Lab 10/22/2017 0742  INR 1.21    ------------------------------------------------------------------------------------------------------------------- No results for input(s): DDIMER in the last 72 hours. -------------------------------------------------------------------------------------------------------------------  Cardiac Enzymes Recent Labs  Lab 10/11/2017 0633 09/28/2017 1031 10/18/2017 1437  TROPONINI 0.54* 0.97* 2.41*   ------------------------------------------------------------------------------------------------------------------ Invalid input(s): POCBNP  ---------------------------------------------------------------------------------------------------------------  Urinalysis    Component Value Date/Time   COLORURINE STRAW (A) 07/23/2017 2226   APPEARANCEUR CLEAR (A) 07/23/2017 2226   LABSPEC 1.008 07/23/2017 2226   PHURINE 6.0 07/23/2017 2226   GLUCOSEU NEGATIVE 07/23/2017 2226   HGBUR NEGATIVE 07/23/2017 2226   BILIRUBINUR NEGATIVE 07/23/2017 2226   KETONESUR NEGATIVE 07/23/2017 2226   PROTEINUR NEGATIVE 07/23/2017 2226   NITRITE NEGATIVE 07/23/2017 2226   LEUKOCYTESUR NEGATIVE 07/23/2017 2226     RADIOLOGY: Dg Chest 1 View  Result Date: 10/20/2017 CLINICAL DATA:  Status post thoracentesis EXAM: CHEST  1 VIEW COMPARISON:  Chest radiograph and chest CT October 19, 2017 FINDINGS: There is no appreciable pneumothorax. There is only a small amount of residual pleural effusion following thoracentesis. There is atelectatic change in the right base. On the left, there is a  small effusion with left base atelectasis. Heart is enlarged with pulmonary vascularity normal. No adenopathy. There is aortic atherosclerosis. Bones are osteoporotic. IMPRESSION: No pneumothorax. Small residual right pleural effusion after thoracentesis. Right base atelectasis. There is a slightly larger left pleural effusion, stable, with left base atelectasis. Stable cardiomegaly. There is aortic atherosclerosis. Aortic Atherosclerosis  (ICD10-I70.0). Electronically Signed   By: Lowella Grip III M.D.   On: 09/30/2017 10:24   Ct Chest Wo Contrast  Result Date: 10/06/2017 CLINICAL DATA:  Shortness of breath, pleural effusion, pneumonia EXAM: CT CHEST WITHOUT CONTRAST TECHNIQUE: Multidetector CT imaging of the chest was performed following the standard protocol without IV contrast. COMPARISON:  Chest x-ray earlier today FINDINGS: Cardiovascular: Diffuse aortic and coronary artery calcifications. No evidence of aortic aneurysm. Cardiomegaly. Mediastinum/Nodes: Scattered small and borderline sized mediastinal lymph nodes. No adenopathy. Moderate-sized hiatal hernia. Lungs/Pleura: Large right pleural effusion and small left pleural effusion. Compressive atelectasis in the left lower lobe. Airspace disease within the right lower lobe likely reflects compressive atelectasis although pneumonia cannot be completely excluded. Upper Abdomen: Imaging into the upper abdomen shows no acute findings. Heavily calcified abdominal aorta. Musculoskeletal: Chest wall soft tissues are unremarkable. No acute bony abnormality. IMPRESSION: Large right pleural effusion and small left pleural effusion. Probable compressive atelectasis in both lower lobes, right worse than left. Pneumonia cannot be completely excluded in the right lower lobe. Cardiomegaly.  Heavily calcified coronary arteries and aorta. Electronically Signed   By: Rolm Baptise M.D.   On: 10/16/2017 07:32   Dg Chest Port 1 View  Result Date: 09/28/2017 CLINICAL DATA:  Respiratory distress. EXAM: PORTABLE CHEST 1 VIEW COMPARISON:  07/23/2017 FINDINGS: Cardiac enlargement. Moderate to large right pleural effusion with basilar consolidation. This may indicate pneumonia. Left lung is clear. No pneumothorax. Calcification of the aorta. Mediastinal contours appear intact. IMPRESSION: Moderate to large right pleural effusion with consolidation in the right lung base possibly representing pneumonia.  Cardiac enlargement. Electronically Signed   By: Lucienne Capers M.D.   On: 10/24/2017 06:37   US Thoracentesis Asp Pleural Space W/img Guide  Result Date: 10/20/2017 INDICATION: Right pleural effusion EXAM: ULTRASOUND GUIDED RIGHT THORACENTESIS MEDICATIONS: None. COMPLICATIONS: None immediate. PROCEDURE: An ultrasound guided thoracentesis was thoroughly discussed with the patient and questions answered. The benefits, risks, alternatives and complications were also discussed. The patient understands and wishes to proceed with the procedure. Written consent was obtained. Ultrasound was performed to localize and mark an adequate pocket of fluid in the right chest. The area was then prepped and draped in the normal sterile fashion. 1% Lidocaine was used for local anesthesia. Under ultrasound guidance a 6 Fr Safe-T-Centesis catheter was introduced. Thoracentesis was performed. The catheter was removed and a dressing applied. FINDINGS: A total of approximately 1.2 L of clear yellow fluid was removed. Samples were sent to the laboratory as requested by the clinical team. IMPRESSION: Successful ultrasound guided right thoracentesis yielding 1.2 L of pleural fluid. Electronically Signed   By: Marybelle Killings M.D.   On: 09/28/2017 10:36    EKG: Orders placed or performed during the hospital encounter of 09/30/2017  . ED EKG  . ED EKG  . EKG 12-Lead  . EKG 12-Lead  . ED EKG 12-Lead  . ED EKG 12-Lead    IMPRESSION AND PLAN:  *Acute respiratory failure As per discharge summary from [facility] 2 weeks ago, patient was discharged on 2 L oxygen after treated for pneumonia. Currently on BiPAP, continue and try to taper. Spoke to  ICU physician. Patient does not have high white blood cell count or fever, so does not appear to have active infection. CT scan on chest also appears to have large right-sided pleural effusion with some atelectasis, would try to get radiology assistance for thoracentesis.  Send the  fluid for further testing.  *Large right-sided pleural effusion Most likely parapneumonic as she was treated for pneumonia 2 weeks ago. Send the fluid for testing.  * Ac on Chronic diastolic congestive heart failure IV Lasix and monitor.  *Elevated troponin- NSTEMI versus stress-induced. Follow serial troponin, monitor on telemetry, cardiology consult Heparin IV drip.  *Chronic microcytic anemia Once her acute issues are resolved, she may need to have iron check and supplemental iron.  *History of CAD Continue cardiac meds, check lipid panel.  All the records are reviewed and case discussed with ED provider. Management plans discussed with the patient, family and they are in agreement.  CODE STATUS: Full code.    Code Status Orders  (From admission, onward)        Start     Ordered   10/09/2017 1526  Full code  Continuous     09/29/2017 1525    Code Status History    Date Active Date Inactive Code Status Order ID Comments User Context   07/24/2017 0143 07/28/2017 2059 Full Code [id number]  Amelia Jo, MD Inpatient   06/01/2016 0703 06/05/2016 2003 Full Code [id number]  Saundra Shelling, MD ED    Advance Directive Documentation     Most Recent Value  Type of Advance Directive  Out of facility DNR (pink MOST or yellow form)  Pre-existing out of facility DNR order (yellow form or pink MOST form)  Pink MOST form placed in chart (order not valid for inpatient use)  "MOST" Form in Place?  -     Patient has advanced direct to sent from nursing home which mentions full scope of treatment and full code.  But in her discharge summary from [facility] DNR was mentioned. I tried calling patient's husband to clarify her CODE STATUS, no success for now.  We will try to talk to him later on to clarify.  TOTAL TIME TAKING CARE OF THIS PATIENT: 50 minutes.    Vaughan Basta M.D on 10/04/2017   Between 7am to 6pm - Pager - [phone]  After 6pm go to [email or web address] - password [email or web address]  [facility]  Office  [phone]  CC: Primary care physician; Juluis Pitch, MD   Note: This dictation was prepared with Dragon dictation along with smaller phrase technology. Any transcriptional errors that result from this process are unintentional.

## 2017-10-19 NOTE — ED Notes (Signed)
Pt in CT with RN and respiratory at this time.

## 2017-10-19 NOTE — Procedures (Signed)
Right thoracentesis 1.2 L clear yellow EBL 0 Comp 0

## 2017-10-19 NOTE — ED Notes (Signed)
Dr. Anselm Jungling aware of pts repeat troponin result. Advised to call cardiology regarding this and pts BP. Secretary informed to page cardiologist.

## 2017-10-19 NOTE — ED Provider Notes (Signed)
-----------------------------------------   7:35 AM on 10/11/2017 -----------------------------------------  Patient care assumed from Dr. Owens Shark.  CT scan shows a large pleural effusion, cannot completely rule out pneumonia.  Patient's troponin has resulted significantly elevated 0.54, most consistent with NSTEMI.  We will start the patient on a heparin infusion, patient will be admitted to the hospitalist service.   Harvest Dark, MD 10/12/2017 [phone]

## 2017-10-19 NOTE — ED Notes (Signed)
Entered room to collect repeat troponin and pt not in room.

## 2017-10-19 NOTE — Progress Notes (Signed)
Family Meeting Note  Advance Directive:no  Today a meeting took place with the Patient and spouse.  The following clinical team members were present during this meeting:MD  The following were discussed:Patient's diagnosis: NSTEMI, Pleural effusion , Patient's progosis: Unable to determine and Goals for treatment: DNR  Patient had 2 different conflicting documents.  [facility] had sent advance direct to mentioning full scope of treatment, and in her discharge records from [place], she was DNR. Talking to husband, to clarify this-he confirms patient would not like to have CPR or defibrillator or go on the life support in any adverse event.  He confirms DNR and he will update the documents later once he is here.  Additional follow-up to be provided: Cardiology.  Time spent during discussion:20 minutes  Vaughan Basta, MD

## 2017-10-19 NOTE — ED Triage Notes (Signed)
Patient to ER via [facility] from [facility] for c/o shortness of breath. Patient has been staying at [facility] for rehab after PNA hospitalization (went to [facility] appro[facility] 10 days go). Patient did not show significant improvement, had begun to receive breathing treatments last 2-3 days. CXR showed fluid at facility, patient had her lasix increased. Patient continued to have shortness of breath and no improvement despite these interventions at facility. Patient also c/o intermittent chest pain tonight, for which she received 1 Nitro SL, which patient states did help a little. Patient arrives to ER with labored breathing.

## 2017-10-19 NOTE — ED Notes (Addendum)
Spoke with admitting concerning pt refusing BIPAP. Saturations are 90-96 on 2L. Bed order will be changed from ICU.

## 2017-10-19 NOTE — ED Notes (Signed)
Pt has removed BIPAP herself. ED MD aware and at bedside, states to leave it off for now and monitor 02.

## 2017-10-19 NOTE — Progress Notes (Signed)
Recent admission to [facility] for PNA, CHF- sent to [facility] 10-12 days ago. More SOB now , sent to ER> Required Bipap initially.  Now Off bipap and sats > 90% per ER nurse.  Assessment and plan  * Ac on ch respi failure   Pleural effusion   Recent PNA, Diastolic CHF    Admit to floor now ( initially planned for Stepdown due to bipap)   IR to help thorocentesis.   Currently No need for Abx, monitor.   * Ac on ch diastolic CHF   IV lasix  * elevated troponin   Monitor, cardio consult.  I could not speak to husband yet, as phone line is disconnected, will try again once he is here.

## 2017-10-19 NOTE — Progress Notes (Signed)
Pharmacy Antibiotic Note  Mallory Rios is a 84 y.o. female admitted on 10/16/2017 with sepsis.  Pharmacy has been consulted for vanc/cefepime dosing. Patient received vanc 1g and cefepime 2g IV x 1 in ED  Plan: Will continue vanc 750 mg IV q24h   Will draw vanc trough 07/28 @ 0400 prior to 4th dose Will continue cefepime 1g IV q24h   Ke 0.0339 T1/2 24 hours Goal trough 15 - 20 mcg/mL  Height: 5' (152.4 cm) Weight: 112 lb (50.8 kg) IBW/kg (Calculated) : 45.5  Temp (24hrs), Avg:98.2 F (36.8 C), Min:98.2 F (36.8 C), Max:98.2 F (36.8 C)  Recent Labs  Lab 09/30/2017 0633 10/16/2017 0736  WBC 11.9*  --   CREATININE 0.72  --   LATICACIDVEN  --  1.9    Estimated Creatinine Clearance: 35.6 mL/min (by C-G formula based on SCr of 0.72 mg/dL).    Allergies  Allergen Reactions  . Demerol [Meperidine]   . Other Other (See Comments)  . Propoxyphene     Thank you for allowing pharmacy to be a part of this patient's care.  Tobie Lords, PharmD, BCPS Clinical Pharmacist 10/14/2017

## 2017-10-20 ENCOUNTER — Encounter: Admission: EM | Disposition: E | Payer: Self-pay | Source: Home / Self Care | Attending: Internal Medicine

## 2017-10-20 DIAGNOSIS — I4891 Unspecified atrial fibrillation: Secondary | ICD-10-CM

## 2017-10-20 DIAGNOSIS — R0602 Shortness of breath: Secondary | ICD-10-CM

## 2017-10-20 DIAGNOSIS — R079 Chest pain, unspecified: Secondary | ICD-10-CM

## 2017-10-20 DIAGNOSIS — I70211 Atherosclerosis of native arteries of extremities with intermittent claudication, right leg: Secondary | ICD-10-CM

## 2017-10-20 DIAGNOSIS — M79604 Pain in right leg: Secondary | ICD-10-CM

## 2017-10-20 HISTORY — PX: LOWER EXTREMITY ANGIOGRAPHY: CATH118251

## 2017-10-20 LAB — LACTIC ACID, PLASMA
Lactic Acid, Venous: 8.2 mmol/L (ref 0.5–1.9)
Lactic Acid, Venous: 6.2 mmol/L (ref 0.5–1.9)

## 2017-10-20 LAB — ECHOCARDIOGRAM COMPLETE
Height: 56 in
Weight: 1835.2 oz

## 2017-10-20 LAB — BASIC METABOLIC PANEL
Anion gap: 16 — ABNORMAL HIGH (ref 5–15)
BUN: 38 mg/dL — AB (ref 8–23)
CALCIUM: 8.3 mg/dL — AB (ref 8.9–10.3)
CHLORIDE: 100 mmol/L (ref 98–111)
CO2: 24 mmol/L (ref 22–32)
Creatinine, Ser: 1.09 mg/dL — ABNORMAL HIGH (ref 0.44–1.00)
GFR calc Af Amer: 51 mL/min — ABNORMAL LOW (ref >60)
GFR, EST NON AFRICAN AMERICAN: 44 mL/min — AB (ref >60)
GLUCOSE: 175 mg/dL — AB (ref 70–99)
Potassium: 4.8 mmol/L (ref 3.5–5.1)
SODIUM: 140 mmol/L (ref 135–145)

## 2017-10-20 LAB — HEPARIN LEVEL (UNFRACTIONATED)
Heparin Unfractionated: 0.18 IU/mL — ABNORMAL LOW (ref 0.30–0.70)
Heparin Unfractionated: 0.48 IU/mL (ref 0.30–0.70)

## 2017-10-20 LAB — CBC
HCT: 30.9 % — ABNORMAL LOW (ref 35.0–47.0)
Hemoglobin: 9.2 g/dL — ABNORMAL LOW (ref 12.0–16.0)
MCH: 21.9 pg — ABNORMAL LOW (ref 26.0–34.0)
MCHC: 29.7 g/dL — ABNORMAL LOW (ref 32.0–36.0)
MCV: 73.8 fL — ABNORMAL LOW (ref 80.0–100.0)
Platelets: 547 K/uL — ABNORMAL HIGH (ref 150–440)
RBC: 4.19 MIL/uL (ref 3.80–5.20)
RDW: 24.9 % — ABNORMAL HIGH (ref 11.5–14.5)
WBC: 15.2 K/uL — ABNORMAL HIGH (ref 3.6–11.0)

## 2017-10-20 LAB — PROTEIN, BODY FLUID (OTHER): TOTAL PROTEIN, BODY FLUID OTHER: 2.5 g/dL

## 2017-10-20 LAB — MRSA PCR SCREENING: MRSA by PCR: NEGATIVE

## 2017-10-20 LAB — PH, BODY FLUID: pH, Body Fluid: 7.4

## 2017-10-20 LAB — CK: CK TOTAL: 209 U/L (ref 38–234)

## 2017-10-20 SURGERY — LOWER EXTREMITY ANGIOGRAPHY
Anesthesia: Moderate Sedation

## 2017-10-20 MED ORDER — SODIUM CHLORIDE 0.9% FLUSH
3.0000 mL | Freq: Two times a day (BID) | INTRAVENOUS | Status: DC
  Administered 2017-10-20 – 2017-10-21 (×3): 3 mL via INTRAVENOUS

## 2017-10-20 MED ORDER — HEPARIN (PORCINE) IN NACL 1000-0.9 UT/500ML-% IV SOLN
INTRAVENOUS | Status: AC
  Filled 2017-10-20: qty 1000

## 2017-10-20 MED ORDER — NALOXONE HCL 0.4 MG/ML IJ SOLN: 0.4000 mg | Freq: Once | INTRAMUSCULAR | Status: DC

## 2017-10-20 MED ORDER — HEPARIN BOLUS VIA INFUSION
1500.0000 [IU] | Freq: Once | INTRAVENOUS | Status: AC
  Administered 2017-10-20: 1500 [IU] via INTRAVENOUS
  Filled 2017-10-20: qty 1500

## 2017-10-20 MED ORDER — NALOXONE HCL 0.4 MG/ML IJ SOLN
INTRAMUSCULAR | Status: AC
  Administered 2017-10-20: 0.4 mg/mL via INTRAVENOUS
  Filled 2017-10-20: qty 1

## 2017-10-20 MED ORDER — MIDAZOLAM HCL 2 MG/2ML IJ SOLN
INTRAMUSCULAR | Status: AC
  Filled 2017-10-20: qty 2

## 2017-10-20 MED ORDER — NALOXONE HCL 0.4 MG/ML IJ SOLN: 0.4000 mg | INTRAMUSCULAR | Status: DC | PRN

## 2017-10-20 MED ORDER — OXYCODONE-ACETAMINOPHEN 5-325 MG PO TABS: 1.0000 | ORAL_TABLET | Freq: Four times a day (QID) | ORAL | Status: DC | PRN

## 2017-10-20 MED ORDER — SODIUM CHLORIDE 0.9% FLUSH: 3.0000 mL | INTRAVENOUS | Status: DC | PRN

## 2017-10-20 MED ORDER — ONDANSETRON HCL 4 MG/2ML IJ SOLN
4.0000 mg | Freq: Four times a day (QID) | INTRAMUSCULAR | Status: DC | PRN
  Administered 2017-10-20 (×2): 4 mg via INTRAVENOUS
  Filled 2017-10-20 (×2): qty 2

## 2017-10-20 MED ORDER — NALOXONE HCL 0.4 MG/ML IJ SOLN
0.4000 mg | Freq: Once | INTRAMUSCULAR | Status: AC
  Administered 2017-10-20: 0.4 mg via INTRAVENOUS

## 2017-10-20 MED ORDER — MIDAZOLAM HCL 2 MG/2ML IJ SOLN
INTRAMUSCULAR | Status: DC | PRN
  Administered 2017-10-20 (×2): 0.5 mg via INTRAVENOUS

## 2017-10-20 MED ORDER — ATROPINE SULFATE 1 MG/10ML IJ SOSY
PREFILLED_SYRINGE | INTRAMUSCULAR | Status: AC
  Filled 2017-10-20: qty 10

## 2017-10-20 MED ORDER — HEPARIN SODIUM (PORCINE) 1000 UNIT/ML IJ SOLN
INTRAMUSCULAR | Status: AC
  Filled 2017-10-20: qty 1

## 2017-10-20 MED ORDER — SODIUM CHLORIDE 0.9% FLUSH
3.0000 mL | Freq: Two times a day (BID) | INTRAVENOUS | Status: DC
  Administered 2017-10-20 – 2017-10-21 (×2): 3 mL via INTRAVENOUS

## 2017-10-20 MED ORDER — FENTANYL CITRATE (PF) 100 MCG/2ML IJ SOLN
INTRAMUSCULAR | Status: DC | PRN
  Administered 2017-10-20 (×2): 25 ug via INTRAVENOUS

## 2017-10-20 MED ORDER — IOPAMIDOL (ISOVUE-300) INJECTION 61%
INTRAVENOUS | Status: DC | PRN
  Administered 2017-10-20: 85 mL via INTRA_ARTERIAL

## 2017-10-20 MED ORDER — MORPHINE SULFATE (PF) 2 MG/ML IV SOLN
INTRAVENOUS | Status: AC
  Filled 2017-10-20: qty 1

## 2017-10-20 MED ORDER — ALTEPLASE 2 MG IJ SOLR
INTRAMUSCULAR | Status: AC
Start: 2017-10-20 — End: ?
  Filled 2017-10-20: qty 4

## 2017-10-20 MED ORDER — MORPHINE SULFATE (PF) 2 MG/ML IV SOLN
2.0000 mg | INTRAVENOUS | Status: DC | PRN
  Administered 2017-10-20: 2 mg via INTRAVENOUS

## 2017-10-20 MED ORDER — ALTEPLASE 2 MG IJ SOLR
INTRAMUSCULAR | Status: DC | PRN
  Administered 2017-10-20: 4 mg

## 2017-10-20 MED ORDER — MORPHINE SULFATE (PF) 2 MG/ML IV SOLN
1.0000 mg | INTRAVENOUS | Status: DC | PRN
  Administered 2017-10-20 – 2017-10-21 (×3): 1 mg via INTRAVENOUS
  Filled 2017-10-20 (×4): qty 1

## 2017-10-20 MED ORDER — LIDOCAINE-EPINEPHRINE (PF) 1 %-1:200000 IJ SOLN
INTRAMUSCULAR | Status: AC
  Filled 2017-10-20: qty 30

## 2017-10-20 MED ORDER — CEFAZOLIN SODIUM-DEXTROSE 2-4 GM/100ML-% IV SOLN
2.0000 g | INTRAVENOUS | Status: AC
  Administered 2017-10-20: 2 g via INTRAVENOUS
  Filled 2017-10-20: qty 100

## 2017-10-20 MED ORDER — HEPARIN SODIUM (PORCINE) 1000 UNIT/ML IJ SOLN
INTRAMUSCULAR | Status: DC | PRN
  Administered 2017-10-20: 3000 [IU] via INTRAVENOUS

## 2017-10-20 MED ORDER — SODIUM CHLORIDE 0.9 % IV SOLN
INTRAVENOUS | Status: DC
  Administered 2017-10-20: 12:00:00 via INTRAVENOUS

## 2017-10-20 MED ORDER — FENTANYL CITRATE (PF) 100 MCG/2ML IJ SOLN
INTRAMUSCULAR | Status: AC
  Filled 2017-10-20: qty 2

## 2017-10-20 SURGICAL SUPPLY — 32 items
BALLN LUTONIX DCB 5X100X130 (BALLOONS) ×3
BALLN ULTRVRSE 2.5X300X150 (BALLOONS) ×3
BALLN ULTRVRSE 4X300X150 (BALLOONS) ×3
BALLOON LUTONIX DCB 5X100X130 (BALLOONS) ×1 IMPLANT
BALLOON ULTRVRSE 2.5X300X150 (BALLOONS) ×1 IMPLANT
BALLOON ULTRVRSE 4X300X150 (BALLOONS) ×1 IMPLANT
CANISTER PENUMBRA ENGINE (MISCELLANEOUS) ×3 IMPLANT
CATH BEACON 5 .035 65 RIM TIP (CATHETERS) ×3 IMPLANT
CATH BEACON 5 .038 100 VERT TP (CATHETERS) ×3 IMPLANT
CATH CXI SUPP ANG 4FR 135 (CATHETERS) ×1 IMPLANT
CATH CXI SUPP ANG 4FR 135CM (CATHETERS) ×3
CATH INDIGO CAT6 KIT (CATHETERS) ×3 IMPLANT
CATH PIG 70CM (CATHETERS) ×3 IMPLANT
CATH VS15FR (CATHETERS) ×3 IMPLANT
DEVICE PRESTO INFLATION (MISCELLANEOUS) ×3 IMPLANT
DEVICE STARCLOSE SE CLOSURE (Vascular Products) ×3 IMPLANT
DEVICE TORQUE .025-.038 (MISCELLANEOUS) ×3 IMPLANT
GLIDEWIRE ADV .035X260CM (WIRE) ×3 IMPLANT
PACK ANGIOGRAPHY (CUSTOM PROCEDURE TRAY) ×3 IMPLANT
SHEATH BRITE TIP 5FRX11 (SHEATH) ×3 IMPLANT
SHEATH BRITE TIP 6FR X 23 (SHEATH) ×3 IMPLANT
SHEATH PINNACLE ST 6F 45CM (SHEATH) ×3 IMPLANT
STENT LIFESTREAM 6X58X80 (Permanent Stent) ×3 IMPLANT
STENT VIABAHN 5X150X120 (Permanent Stent) ×3 IMPLANT
STENT VIABAHN 5X250X120 (Permanent Stent) ×3 IMPLANT
SYR MEDRAD MARK V 150ML (SYRINGE) ×3 IMPLANT
TOWEL OR 17X26 4PK STRL BLUE (TOWEL DISPOSABLE) ×3 IMPLANT
TUBING CIL FLEX 10 FLL-RA (TUBING) ×3 IMPLANT
TUBING CONTRAST HIGH PRESS 72 (TUBING) ×3 IMPLANT
WIRE G 018X200 V18 (WIRE) ×3 IMPLANT
WIRE G V18X300CM (WIRE) ×3 IMPLANT
WIRE J 3MM .035X145CM (WIRE) ×3 IMPLANT

## 2017-10-20 NOTE — Progress Notes (Signed)
Dr. Marcille Blanco was informed of non palpability of doppler pulses on right dorsalis pedis and posterior pedis, and patient c/o 10/10 pain on the right leg. 2mg  of IV morphine was administered per order. Patient stated that she feels relieved.

## 2017-10-20 NOTE — Progress Notes (Signed)
* [facility]     Assessment and Plan:   Acute hypoxic respiratory failure. Large right-sided pleural effusion with compressive atelectasis. Cardiomegaly, [name], thus far results appear consistent with transudate of effusion, [name], cardiology consulted. - Patient may require repeat thoracentesis in the future if the patient's pleural fluid re-accumulates. -Continue to wean down/off oxygen as tolerated.     Date: 10/18/2017  MRN# [id number] Mallory Rios May 24, 1940   Mallory Rios is a 84 y.o. old female seen in follow up for chief complaint of  Chief Complaint  Patient presents with  . Shortness of Breath     HPI:   Patient is status post thoracentesis yesterday, she has been doing well, however her oxygen requirements have again increased, she is now back up to 5 L nasal cannula.  Medication:    Current Facility-Administered Medications:  .  0.9 %  sodium chloride infusion, , Intravenous, Continuous, Dew, Erskine Squibb, MD .  acetaminophen (TYLENOL) tablet 650 mg, 650 mg, Oral, Q6H PRN, Vaughan Basta, MD, 650 mg at 10/02/2017 2138 .  aspirin EC tablet 81 mg, 81 mg, Oral, Daily, Vaughan Basta, MD, 81 mg at 10/10/2017 0942 .  atorvastatin (LIPITOR) tablet 40 mg, 40 mg, Oral, q1800, Vaughan Basta, MD, 40 mg at 09/26/2017 2138 .  ceFAZolin (ANCEF) IVPB 2g/100 mL premix, 2 g, Intravenous, 30 min Pre-Op, Dew, Erskine Squibb, MD .  docusate sodium (COLACE) capsule 100 mg, 100 mg, Oral, BID PRN, Vaughan Basta, MD .  famotidine (PEPCID) tablet 10 mg, 10 mg, Oral, BID, Vaughan Basta, MD, 10 mg at 10/18/2017 0943 .  ferrous sulfate tablet 325 mg, 325 mg, Oral, Q breakfast, Vaughan Basta, MD .  furosemide (LASIX) injection 20 mg, 20 mg, Intravenous, Q12H, Vaughan Basta, MD, 20 mg at 10/17/2017 0903 .   heparin ADULT infusion 100 units/mL (25000 units/29mL sodium chloride 0.45%), 1,000 Units/hr, Intravenous, Continuous, Harvest Dark, MD, Last Rate: 10 mL/hr at 10/10/2017 0912, 1,000 Units/hr at 10/09/2017 0912 .  ipratropium-albuterol (DUONEB) 0.5-2.5 (3) MG/3ML nebulizer solution 3 mL, 3 mL, Nebulization, Q4H PRN, Vaughan Basta, MD, 3 mL at 10/03/2017 0905 .  metoprolol tartrate (LOPRESSOR) tablet 12.5 mg, 12.5 mg, Oral, BID, Vaughan Basta, MD, 12.5 mg at 10/16/2017 0944 .  mirtazapine (REMERON) tablet 7.5 mg, 7.5 mg, Oral, QHS, Vaughan Basta, MD, 7.5 mg at 10/11/2017 2139 .  morphine 2 MG/ML injection 1-2 mg, 1-2 mg, Intravenous, Q4H PRN, [name], MD, 1 mg at 09/27/2017 0936 .  morphine 2 MG/ML injection, , , ,  .  ondansetron (ZOFRAN) injection 4 mg, 4 mg, Intravenous, Q6H PRN, Lance Coon, MD, 4 mg at 10/01/2017 0904 .  sodium chloride flush (NS) 0.9 % injection 3 mL, 3 mL, Intravenous, Q12H, [name], MD .  sodium chloride flush (NS) 0.9 % injection 3 mL, 3 mL, Intravenous, Q12H, [name], MD, 3 mL at 10/10/2017 0916 .  sodium chloride flush (NS) 0.9 % injection 3 mL, 3 mL, Intravenous, PRN, [name], MD .  vitamin C (ASCORBIC ACID) tablet 500 mg, 500 mg, Oral, Daily, Vaughan Basta, MD, 500 mg at 10/14/2017 0944 .  vitamin E capsule 400 Units, 400 Units, Oral, Daily, Vaughan Basta, MD, 400 Units at 10/10/2017 0944   Allergies:  Demerol [meperidine]; Other; and Propoxyphene      LABORATORY PANEL:   CBC Recent Labs  Lab 10/25/2017 0221  WBC 15.2*  HGB  9.2*  HCT 30.9*  PLT 547*   ------------------------------------------------------------------------------------------------------------------  Chemistries  Recent Labs  Lab 10/23/2017 0221  NA 140  K 4.8  CL 100  CO2 24  GLUCOSE 175*  BUN 38*  CREATININE 1.09*  CALCIUM 8.3*    ------------------------------------------------------------------------------------------------------------------  Cardiac Enzymes Recent Labs  Lab 10/16/2017 1908  TROPONINI 4.81*   ------------------------------------------------------------  RADIOLOGY:   No results found for this or any previous visit. Results for orders placed during the hospital encounter of 06/01/16  DG Chest 2 View   Narrative CLINICAL DATA:  Palpitations and dyspnea tonight.  EXAM: CHEST  2 VIEW  COMPARISON:  03/11/2016  FINDINGS: Stable cardiomegaly and hyperinflation. There are small pleural effusions bilaterally, new. Mild interstitial prominence may represent a degree of interstitial edema. No confluent airspace opacities. Hiatal hernia noted.  IMPRESSION: Stable hyperinflation and cardiomegaly, with superimposed interstitial edema and small pleural effusions.   Electronically Signed   By: Andreas Newport M.D.   On: 06/01/2016 03:53    ------------------------------------------------------------------------------------------------------------------  Thank  you for allowing [facility] to assist in the care of your patient. Our recommendations are noted above.  Please contact us if we can be of further service.   Marda Stalker, M.D., F.C.C.P.  Board Certified in Internal Medicine, Pulmonary Medicine, [place], and Sleep Medicine.  [facility] Office Number: [phone]  10/19/2017

## 2017-10-20 NOTE — Op Note (Signed)
[facility]  Percutaneous Study/Intervention Procedural Note   Date of Surgery: 09/26/2017  Surgeon(s):Daylan Boggess    Assistants:none  Pre-operative Diagnosis: ischemic RLE  Post-operative diagnosis:  Same  Procedure(s) Performed:             1.  Ultrasound guidance for vascular access left femoral artery             2.  Catheter placement into right anterior tibial artery from left femoral approach             3.  Aortogram and selective right lower extremity angiogram             4.   Catheter directed thrombolytic therapy instilled in the right common iliac artery with 4 mg of TPA             5.   Mechanical thrombectomy of the right common iliac artery, right SFA, right popliteal artery, and right anterior tibial artery with the penumbra cat 6 device  6.  Percutaneous transluminal angioplasty of right common iliac artery with 5 mm diameter by 10 cm length Lutonix drug-coated angioplasty balloon  7.  Percutaneous transluminal angioplasty of right anterior tibial artery with 2.5 mm diameter by 30 cm length angioplasty balloon  8.  Percutaneous transluminal angioplasty of right SFA and popliteal arteries with 2 inflations with a 4 mm diameter by 30 cm length angioplasty balloon  9.  Viabahn covered stent placement to the right SFA and popliteal arteries for residual stenosis and thrombosis after above procedures using a 5 mm diameter by 25 cm length stent and a 5 mm diameter by 15 cm length stent  10.  Lifestream stent placement to the right common iliac artery with a 6 mm diameter by 58 mm length covered balloon expandable stent for greater than 50% residual stenosis and thrombosis after above procedures             11.  StarClose closure device left femoral artery  EBL: 200 cc  Contrast: 85 cc  Fluoro Time: 16.1 minutes  Moderate Conscious Sedation Time: approximately 80 minutes using 1 mg of Versed and 50 Mcg of Fentanyl              Indications:  Patient  is a 84 y.o.female with ischemia of the right lower extremity that presented early this morning.  This was likely an acute on chronic situation, and was clearly new pain in the right foot clinically. The patient is brought in for angiography for further evaluation and potential treatment.  Due to the limb threatening nature of the situation, angiogram was performed for attempted limb salvage. The patient is aware that if the procedure fails, amputation would be expected.  The patient also understands that even with successful revascularization, amputation may still be required due to the severity of the situation. Risks and benefits are discussed and informed consent is obtained.   Procedure:  The patient was identified and appropriate procedural time out was performed.  The patient was then placed supine on the table and prepped and draped in the usual sterile fashion. Moderate conscious sedation was administered during a face to face encounter with the patient throughout the procedure with my supervision of the RN administering medicines and monitoring the patient's vital signs, pulse oximetry, telemetry and mental status throughout from the start of the procedure until the patient was taken to the recovery room. Ultrasound was used to evaluate the left common femoral artery.  A digital ultrasound  image was acquired.  A Seldinger needle was used to access the left common femoral artery under direct ultrasound guidance and a permanent image was performed.  A 0.035 J wire was advanced without resistance and a 5Fr sheath was placed.  Pigtail catheter was placed into the aorta and an AP aortogram was performed. This demonstrated an extensively calcified aorta and iliac arteries.  There appeared to be thrombus and a stenosis in the proximal right common iliac artery as well as a very calcific lesion in the distal aorta.  The right common iliac artery was nearly occluded from this lesion.  The left common iliac artery  had a mild to moderate stenosis.  The renal artery flow appeared to show some stenosis on the left although was not particularly well visualized and good flow on the right. I then crossed the aortic bifurcation and advanced to the right femoral head using a VS 1 catheter and an advantage wire. Selective right lower extremity angiogram was then performed. This demonstrated near flush occlusion of the superficial femoral artery with faint reconstitution of the popliteal artery.  It was difficult to discern whether this was acute or chronic.  There then appeared to be very poor tibial flow with stenosis or occlusion of all 3 vessels suggested on the initial imaging.  The flow was so poor, this is very difficult to discern. The patient was systemically heparinized and I placed 4 mg of TPA in the right common iliac artery instilled through a 23 cm 6 French sheath that was placed just beyond the aortic bifurcation.  After this dwelled for several minutes, mechanical thrombectomy was performed with the penumbra cat 6 device in the right common iliac artery.  Following this, there remained a near occlusive stenosis.  A 5 mm diameter by 10 cm length Lutonix drug-coated angioplasty balloon was then inflated in the right iliac artery and taken to 10 atm for 1 minute.  There was still stenosis in the proximal right common iliac artery, and this would be addressed later with a covered stent.  At this point, I turned my attention to trying to improve the perfusion on the right leg more distally.  A 6 French destination sheath was then placed over the [employer] wire. I then used a Kumpe catheter and the advantage wire to get down into the SFA and cross this with surprisingly little difficulty indicating that he more acute component was present.  Intraluminal flow was confirmed in the popliteal artery which showed thrombus and near occlusive stenosis in the proximal anterior tibial artery but this appeared to be the best  runoff vessel distally.  The penumbra cat 6 device was then used with several passes in the right SFA and popliteal artery but a calcific lesion in the distal SFA and popliteal artery above the knee made this difficult to cross initially.  Multiple passes were made with no improvement.  A 4 mm diameter by 30 cm length angioplasty balloon was then taken down from the below-knee popliteal artery all the way up to the common femoral artery with 2 inflations of 8 to 10 atm for 1 minute.  Completion imaging showed thrombus and stenosis residual in the SFA and popliteal arteries and more passes with the penumbra cat 6 catheter was performed.  Now this was able to pass down through the SFA and popliteal arteries and we actually took this down to the proximal portion of the anterior tibial artery.  The flow remained extremely poor.  We had  a exchanged for a 0.018 wire and a 2.5 mm diameter by 30 cm length angioplasty balloon was inflated from the distal anterior tibial artery up across its origin into the popliteal artery.  This was inflated to 12 atm for 1 minute.  Completion imaging showed residual thrombus and near occlusive stenosis at multiple levels in the popliteal artery, distal SFA, and all the way up to the more proximal SFA.  2 covered stents were then placed to encompass these lesions from about 1-2 centimeters below the SFA origin down to the below-knee popliteal artery.  This was a 5 mm diameter by 25 cm length covered stent and a 5 mm diameter by 15 cm length Viabahn covered stent.  These were postdilated with a 4 mm balloon with less than 10% residual stenosis.  The runoff was markedly improved and actually some flow was seen down the posterior tibial artery as well as the anterior tibial artery following these images.  The flow was still somewhat sluggish and there was still an inflow lesion in the iliac artery.  I did not feel there was much more we can do for the infrainguinal circulation at this point.   I then pulled the sheath back to the aortic bifurcation after exchanging for a 0.035 wire.  A 6 mm diameter by 58 mm length lifestream stent was deployed from the origin of the right common iliac artery down to the bottom of the lesion in the distal right common iliac artery.  This was inflated to 16 atm and completion imaging showed only about a 20% residual stenosis in the right common iliac artery. I elected to terminate the procedure. The sheath was removed and StarClose closure device was deployed in the left femoral artery with excellent hemostatic result.  Of note, there appeared to be occlusion of the left common femoral artery on our completion image and this was difficult to discern whether or not this was an acute or chronic situation.  The patient will be maintained on heparin and reevaluated to see if any further intervention will be necessary on the left leg.  The patient is exceedingly sick with renal insufficiency and she had already gotten a significant amount of contrast and fluoroscopy time to try to revascularize her right leg.  The patient was taken to the recovery room in stable condition having tolerated the procedure well.  Findings:               Aortogram:  This demonstrated an extensively calcified aorta and iliac arteries.  There appeared to be thrombus and a stenosis in the proximal right common iliac artery as well as a very calcific lesion in the distal aorta.  The right common iliac artery was nearly occluded from this lesion.  The left common iliac artery had a mild to moderate stenosis.  The renal artery flow appeared to show some stenosis on the left although was not particularly well visualized and good flow on the right.             Right lower Extremity:  Near flush occlusion of the superficial femoral artery with faint reconstitution of the popliteal artery.  There then appeared to be very poor tibial flow with stenosis or occlusion of all 3 vessels suggested on the initial  imaging.  The flow was so poor, this is very difficult to discern.   Disposition: Patient was taken to the recovery room in stable condition having tolerated the procedure well.  Complications: None  Leotis Pain  10/07/2017 2:47 PM   This note was created with Dragon Medical transcription system. Any errors in dictation are purely unintentional.

## 2017-10-20 NOTE — Consult Note (Signed)
[facility] Vascular Consult Note  MRN : [id number]  Mallory Rios is a 84 y.o. ([birth year]) female who presents with chief complaint of  Chief Complaint  Patient presents with  . Shortness of Breath  .  History of Present Illness: I am asked to see the patient by Dr. Bridgett Larsson for evaluation of an ischemic right leg. The patient is a debilitated 84 yo female admitted with shortness of breath.  She has a rising troponin and has been having chest pain.  Apparently, at some point in the middle of the night she began complaining of acute right foot and lower leg pain.  She was noticed to be cool and pulseless in the right foot.  She has already been on a heparin drip and this continues.  No left leg symptoms.  The patient is very debilitated but does respond to pain in the foot is painful to touch.  No previous vascular surgery or intervention to the lower extremities reported.  No fevers or chills.  There is no clear inciting event that started the pain in the middle the night.  There was no trauma or injury.  There was a high suspicion for a cardiac embolus and ischemia and we are consulted for further evaluation and treatment  Current Facility-Administered Medications  Medication Dose Route Frequency Provider Last Rate Last Dose  . 0.9 %  sodium chloride infusion   Intravenous Continuous Algernon Huxley, MD      . acetaminophen (TYLENOL) tablet 650 mg  650 mg Oral Q6H PRN Vaughan Basta, MD   650 mg at 10/04/2017 2138  . aspirin EC tablet 81 mg  81 mg Oral Daily Vaughan Basta, MD   81 mg at 10/24/2017 0942  . atorvastatin (LIPITOR) tablet 40 mg  40 mg Oral q1800 Vaughan Basta, MD   40 mg at 10/16/2017 2138  . ceFAZolin (ANCEF) IVPB 2g/100 mL premix  2 g Intravenous 30 min Pre-Op Algernon Huxley, MD      . docusate sodium (COLACE) capsule 100 mg  100 mg Oral BID PRN Vaughan Basta, MD      . famotidine (PEPCID) tablet 10 mg  10 mg Oral BID Vaughan Basta, MD   10 mg at 10/07/2017 0943  . ferrous sulfate tablet 325 mg  325 mg Oral Q breakfast Vaughan Basta, MD      . furosemide (LASIX) injection 20 mg  20 mg Intravenous Q12H Vaughan Basta, MD   20 mg at 10/12/2017 0903  . heparin ADULT infusion 100 units/mL (25000 units/264mL sodium chloride 0.45%)  1,000 Units/hr Intravenous Continuous Harvest Dark, MD 10 mL/hr at 10/19/2017 0912 1,000 Units/hr at 10/08/2017 0912  . ipratropium-albuterol (DUONEB) 0.5-2.5 (3) MG/3ML nebulizer solution 3 mL  3 mL Nebulization Q4H PRN Vaughan Basta, MD   3 mL at 10/09/2017 0905  . metoprolol tartrate (LOPRESSOR) tablet 12.5 mg  12.5 mg Oral BID Vaughan Basta, MD   12.5 mg at 09/27/2017 0944  . mirtazapine (REMERON) tablet 7.5 mg  7.5 mg Oral QHS Vaughan Basta, MD   7.5 mg at 09/27/2017 2139  . morphine 2 MG/ML injection 1-2 mg  1-2 mg Intravenous Q4H PRN [name], MD   1 mg at 09/27/2017 0936  . morphine 2 MG/ML injection           . ondansetron (ZOFRAN) injection 4 mg  4 mg Intravenous Q6H PRN Lance Coon, MD   4 mg at 10/20/17 0904  . sodium chloride flush (NS)  0.9 % injection 3 mL  3 mL Intravenous Q12H [name], MD      . sodium chloride flush (NS) 0.9 % injection 3 mL  3 mL Intravenous Q12H [name], MD   3 mL at 10/06/2017 0916  . sodium chloride flush (NS) 0.9 % injection 3 mL  3 mL Intravenous PRN [name], MD      . vitamin C (ASCORBIC ACID) tablet 500 mg  500 mg Oral Daily Vaughan Basta, MD   500 mg at 09/27/2017 0944  . vitamin E capsule 400 Units  400 Units Oral Daily Vaughan Basta, MD   400 Units at 10/01/2017 0944    Past Medical History:  Diagnosis Date  . A-fib ([place])   . Cancer ([place])   . CHF (congestive heart failure) ([place])   . Hypertension   . MI (myocardial infarction) [facility])     Past Surgical History:  Procedure Laterality Date  . ABDOMINAL HYSTERECTOMY    . CARDIAC SURGERY     stents x6    Social History Social  History   Tobacco Use  . Smoking status: Never Smoker  . Smokeless tobacco: Never Used  Substance Use Topics  . Alcohol use: No  . Drug use: No    Family History Family History  Problem Relation Age of Onset  . CAD Neg Hx   . Diabetes Neg Hx   . Hypertension Neg Hx   No bleeding disorders, clotting disorders, autoimmune diseases, or aneurysms  Allergies  Allergen Reactions  . Demerol [Meperidine]   . Other Other (See Comments)  . Propoxyphene      REVIEW OF SYSTEMS (Negative unless checked)  Constitutional: [] Weight loss  [] Fever  [] Chills Cardiac: [x] Chest pain   [] Chest pressure   [x] Palpitations   [x] Shortness of breath when laying flat   [x] Shortness of breath at rest   [x] Shortness of breath with exertion. Vascular:  [] Pain in legs with walking   [] Pain in legs at rest   [] Pain in legs when laying flat   [] Claudication   [] Pain in feet when walking  [x] Pain in feet at rest  [x] Pain in feet when laying flat   [] History of DVT   [] Phlebitis   [] Swelling in legs   [] Varicose veins   [] Non-healing ulcers Pulmonary:   [] Uses home oxygen   [] Productive cough   [] Hemoptysis   [] Wheeze  [] COPD   [] Asthma Neurologic:  [] Dizziness  [] Blackouts   [] Seizures   [] History of stroke   [] History of TIA  [] Aphasia   [] Temporary blindness   [] Dysphagia   [] Weakness or numbness in arms   [] Weakness or numbness in legs Musculoskeletal:  [x] Arthritis   [] Joint swelling   [] Joint pain   [] Low back pain Hematologic:  [x] Easy bruising  [] Easy bleeding   [] Hypercoagulable state   [] Anemic  [] Hepatitis Gastrointestinal:  [] Blood in stool   [] Vomiting blood  [] Gastroesophageal reflux/heartburn   [] Difficulty swallowing. Genitourinary:  [] Chronic kidney disease   [] Difficult urination  [] Frequent urination  [] Burning with urination   [] Blood in urine Skin:  [] Rashes   [] Ulcers   [] Wounds Psychological:  [] History of anxiety   []  History of major depression.  Physical Examination  Vitals:    09/30/2017 0431 09/28/2017 0849 10/06/2017 0901 10/11/2017 0914  BP: (!) 112/44 106/72    Pulse: (!) 56 66    Resp: 16 (!) 28 20 (!) 26  Temp: 97.9 F (36.6 C)     TempSrc: Oral     SpO2: 95% Marland Kitchen)  88% 100% 96%  Weight:      Height:       Body mass index is 25.72 kg/m. Gen:  WD/WN, NAD Head: [place]/AT, No temporalis wasting.  Ear/Nose/Throat: Hearing diminished, nares w/o erythema or drainage, oropharynx w/o Erythema/Exudate Eyes: Sclera non-icteric, conjunctiva clear Neck: Trachea midline.  No JVD.  Pulmonary:  Diminished more on the right than the left, respirations appear somewhat labored on supplemental oxygen Cardiac: irregularly irregular Vascular:  Vessel Right Left  Radial Palpable Palpable                      Popliteal Not Palpable 1+ Palpable  PT Not Palpable 1+ Palpable  DP Not Palpable 1+ Palpable   Gastrointestinal: soft, non-tender/non-distended.  Musculoskeletal: Right foot and lower leg are cold with diminished capillary refill.  Right foot and ankle are tender to the touch.  Arthritic changes to the hands and feet.  Left foot is warm with good capillary refill. No edema. Neurologic: Sensation grossly intact in extremities.  Somewhat difficult to discern due to the patient's somnolence.  Speech is labored. Motor exam as listed above. Psychiatric: Judgment and insight are difficult to discern.  Patient is very somnolent Dermatologic: No rashes or ulcers noted.  No cellulitis or open wounds.       CBC Lab Results  Component Value Date   WBC 15.2 (H) 09/28/2017   HGB 9.2 (L) 10/03/2017   HCT 30.9 (L) 09/27/2017   MCV 73.8 (L) 10/02/2017   PLT 547 (H) 10/02/2017    BMET    Component Value Date/Time   NA 140 10/26/2017 0221   K 4.8 10/25/2017 0221   CL 100 10/10/2017 0221   CO2 24 10/11/2017 0221   GLUCOSE 175 (H) 10/25/2017 0221   BUN 38 (H) 10/12/2017 0221   CREATININE 1.09 (H) 10/05/2017 0221   CALCIUM 8.3 (L) 10/02/2017 0221   GFRNONAA 44 (L)  10/08/2017 0221   GFRAA 51 (L) 10/20/2017 0221   Estimated Creatinine Clearance: 24.5 mL/min (A) (by C-G formula based on SCr of 1.09 mg/dL (H)).  COAG Lab Results  Component Value Date   INR 1.21 10/04/2017   INR 1.18 07/23/2017    Radiology Dg Chest 1 View  Result Date: 10/12/2017 CLINICAL DATA:  Status post thoracentesis EXAM: CHEST  1 VIEW COMPARISON:  Chest radiograph and chest CT October 19, 2017 FINDINGS: There is no appreciable pneumothorax. There is only a small amount of residual pleural effusion following thoracentesis. There is atelectatic change in the right base. On the left, there is a small effusion with left base atelectasis. Heart is enlarged with pulmonary vascularity normal. No adenopathy. There is aortic atherosclerosis. Bones are osteoporotic. IMPRESSION: No pneumothorax. Small residual right pleural effusion after thoracentesis. Right base atelectasis. There is a slightly larger left pleural effusion, stable, with left base atelectasis. Stable cardiomegaly. There is aortic atherosclerosis. Aortic Atherosclerosis (ICD10-I70.0). Electronically Signed   By: Lowella Grip III M.D.   On: 10/23/2017 10:24   Ct Chest Wo Contrast  Result Date: 10/08/2017 CLINICAL DATA:  Shortness of breath, pleural effusion, pneumonia EXAM: CT CHEST WITHOUT CONTRAST TECHNIQUE: Multidetector CT imaging of the chest was performed following the standard protocol without IV contrast. COMPARISON:  Chest x-ray earlier today FINDINGS: Cardiovascular: Diffuse aortic and coronary artery calcifications. No evidence of aortic aneurysm. Cardiomegaly. Mediastinum/Nodes: Scattered small and borderline sized mediastinal lymph nodes. No adenopathy. Moderate-sized hiatal hernia. Lungs/Pleura: Large right pleural effusion and small left pleural effusion. Compressive atelectasis in  the left lower lobe. Airspace disease within the right lower lobe likely reflects compressive atelectasis although pneumonia cannot be  completely excluded. Upper Abdomen: Imaging into the upper abdomen shows no acute findings. Heavily calcified abdominal aorta. Musculoskeletal: Chest wall soft tissues are unremarkable. No acute bony abnormality. IMPRESSION: Large right pleural effusion and small left pleural effusion. Probable compressive atelectasis in both lower lobes, right worse than left. Pneumonia cannot be completely excluded in the right lower lobe. Cardiomegaly.  Heavily calcified coronary arteries and aorta. Electronically Signed   By: Rolm Baptise M.D.   On: 10/17/2017 07:32   Dg Chest Port 1 View  Result Date: 10/04/2017 CLINICAL DATA:  Respiratory distress. EXAM: PORTABLE CHEST 1 VIEW COMPARISON:  07/23/2017 FINDINGS: Cardiac enlargement. Moderate to large right pleural effusion with basilar consolidation. This may indicate pneumonia. Left lung is clear. No pneumothorax. Calcification of the aorta. Mediastinal contours appear intact. IMPRESSION: Moderate to large right pleural effusion with consolidation in the right lung base possibly representing pneumonia. Cardiac enlargement. Electronically Signed   By: Lucienne Capers M.D.   On: 10/10/2017 06:37   US Thoracentesis Asp Pleural Space W/img Guide  Result Date: 10/12/2017 INDICATION: Right pleural effusion EXAM: ULTRASOUND GUIDED RIGHT THORACENTESIS MEDICATIONS: None. COMPLICATIONS: None immediate. PROCEDURE: An ultrasound guided thoracentesis was thoroughly discussed with the patient and questions answered. The benefits, risks, alternatives and complications were also discussed. The patient understands and wishes to proceed with the procedure. Written consent was obtained. Ultrasound was performed to localize and mark an adequate pocket of fluid in the right chest. The area was then prepped and draped in the normal sterile fashion. 1% Lidocaine was used for local anesthesia. Under ultrasound guidance a 6 Fr Safe-T-Centesis catheter was introduced. Thoracentesis was  performed. The catheter was removed and a dressing applied. FINDINGS: A total of approximately 1.2 L of clear yellow fluid was removed. Samples were sent to the laboratory as requested by the clinical team. IMPRESSION: Successful ultrasound guided right thoracentesis yielding 1.2 L of pleural fluid. Electronically Signed   By: Marybelle Killings M.D.   On: 10/11/2017 10:36      Assessment/Plan 1.  Acute ischemia to the right lower extremity despite being on heparin.  With an active elevation of troponin and cardiac issues with atrial fibrillation, this is almost certainly a cardiac embolus.  This is a very critical and limb threatening situation in a patient who is extremely ill and at high risk of morbidity and mortality.  She is really not a surgical candidate at this point.  We will attempt a right lower extremity angiogram to see if there is any hope at revascularization, but it was discussed with the patient that she is extremely high risk for limb loss. 2.  Atrial fibrillation.  Likely cardiac embolus creating ischemia 3.  Elevated troponin/MI.  Already on heparin.  Very poor prognosis 4.  Viral effusion.  Status post thoracentesis yesterday.  Remains quite short of breath.  On supplemental oxygen   Leotis Pain, MD  10/10/2017 10:31 AM    This note was created with Dragon medical transcription system.  Any error is purely unintentional

## 2017-10-20 NOTE — Progress Notes (Signed)
Per pharmacy heparin drip rate was increased to 1000 units,and a bolus of 1500 units administered. Patient is now resting comfortably.

## 2017-10-20 NOTE — Progress Notes (Signed)
Patient returned from specials with RR of 40. HR in the 30's and low 40's.  Normal BP.  She was confused and combative.  Refused to leave her NRB in place.  It had to be held to her face.  Sats ranged from high 60's to low 80's.  Narcan 0.4 mg  Given.  She woke up for a brief period, continued to be combative and confused.  Started on BIPAP to stablize her O2 sats. Transferred to ICU. L groin site clean, dry, intact.

## 2017-10-20 NOTE — Clinical Social Work Note (Signed)
CSW received consult that patient is from [facility] for short term rehab.  CSW to follow patient's progress throughout discharge planning.  Jones Broom. [place], MSW, [place]  09/30/2017 10:18 AM

## 2017-10-20 NOTE — Progress Notes (Signed)
Advanced Care Plan.  Purpose of Encounter: Palliative care and hospice care. Parties in Attendance: The patient, her husband, RNs and me. Patient's Decisional Capacity: No. Medical Story: CatherineSykesis a89 y.o.femalewith a known history of A. fib, CHF, hypertension, myocardial infarction, recent admission to [facility] 2 weeks ago for pneumonia and anasarca-[facility].She was more short of breath worsening for the last few days, so was sent back to emergency room.  She is admitted for Acute respiratory failure with hypoxia due to CHF and pleural effusion, non-STEMI, A. fib, acute ischemia to the right lower extremity, on heparin drip.  She has worsening respiratory failure after vascular procedures just now.  She is put on BiPAP and will be transferred to stepdown unit.  I discussed the patient's critical condition, poor prognosis, palliative care and hospice care with the patient's husband.  Per her husband, the patient is still in DNR status.  He agreed to get medical treatment including BiPAP and medications.  He agreed to get palliative care consult.  Goals of Care Determinations: Palliative care or hospice care. Plan:  Code Status: DNR. Time spent discussing advance care planning: 22 minutes.

## 2017-10-20 NOTE — Progress Notes (Signed)
ANTICOAGULATION CONSULT NOTE - Initial Consult  Pharmacy Consult for heparin Indication: chest pain/ACS  Allergies  Allergen Reactions  . Demerol [Meperidine]   . Other Other (See Comments)    darvocet   . Propoxyphene     Patient Measurements: Height: 4\' 8"  (142.2 cm) Weight: 114 lb 11.2 oz (52 kg) IBW/kg (Calculated) : 36.3 Heparin Dosing Weight: 51 kg  Vital Signs: Temp: 97.8 F (36.6 C) (07/25 1629) Temp Source: Axillary (07/25 1629) BP: 117/84 (07/25 1621) Pulse Rate: 60 (07/25 1621)  Labs: Recent Labs    09/27/2017 6160 10/12/2017 0742 10/26/2017 1031 10/10/2017 1437 10/02/2017 1600 10/14/2017 1908 10/09/2017 0221  HGB 8.6*  --   --   --   --   --  9.2*  HCT 27.7*  --   --   --   --   --  30.9*  PLT 469*  --   --   --   --   --  547*  APTT  --  28  --   --   --   --   --   LABPROT  --  15.2  --   --   --   --   --   INR  --  1.21  --   --   --   --   --   HEPARINUNFRC  --   --   --   --  <0.10*  --  0.18*  CREATININE 0.72  --   --   --   --   --  1.09*  TROPONINI 0.54*  --  0.97* 2.41*  --  4.81*  --     Estimated Creatinine Clearance: 24.5 mL/min (A) (by C-G formula based on SCr of 1.09 mg/dL (H)).   Medical History: Past Medical History:  Diagnosis Date  . A-fib ([place])   . Cancer ([place])   . CHF (congestive heart failure) ([place])   . Hypertension   . MI (myocardial infarction) ([place])     Medications:  Scheduled:  . aspirin EC  81 mg Oral Daily  . atorvastatin  40 mg Oral q1800  . famotidine  10 mg Oral BID  . ferrous sulfate  325 mg Oral Q breakfast  . furosemide  20 mg Intravenous Q12H  . metoprolol tartrate  12.5 mg Oral BID  . mirtazapine  7.5 mg Oral QHS  . naLOXone (NARCAN)  injection  0.4 mg Intravenous Once  . sodium chloride flush  3 mL Intravenous Q12H  . sodium chloride flush  3 mL Intravenous Q12H  . vitamin C  500 mg Oral Daily  . vitamin E  400 Units Oral Daily     Patient admitted s/t SOB and right-sided CP. EKG shows afib w/ ICVD  placement Initial trop 0.54, patient is not on PTA anticoagulation.  Goal of Therapy:  Heparin level 0.3-0.7 units/ml Monitor platelets by anticoagulation protocol: Yes   Assessment/Plan:  Heparin resumed s/p angiogram pending cardiology evaluation. Dr. Lucky Cowboy is OK with normal goal and bolus.   Heparin resumed at previous rate. Will check a HL in 8 hours.   Ulice Dash, PharmD Clinical Pharmacist  10/06/2017 5:23 PM

## 2017-10-20 NOTE — Progress Notes (Signed)
Dr. Jannifer Franklin was notified of patient's acute c/o right leg pain, care RN also informed Dr. Jannifer Franklin of patient's right foot being cold, and  non palpability of  the right posterior tibial, and dorsalis pedis. No new order , care RN to update Dr. Jannifer Franklin of doppler pulses.

## 2017-10-20 NOTE — Progress Notes (Signed)
   10/13/2017 1545  Clinical Encounter Type  Visited With Patient  Visit Type Initial;Spiritual support  Referral From Nurse  Consult/Referral To Chaplain  Spiritual Encounters  Spiritual Needs Prayer   CH responded to a rapid response page for room 247. The patient appeared to be having breathing problems and the rapid response care team was actively treating the patient once I arrived to the patient's room. No family was present. I stayed and offered silent prayer and support for those treating the patient. CH will follow up.

## 2017-10-20 NOTE — Consult Note (Signed)
Reason for Consult: Atrial fibrillation congestive heart failure Referring Physician: To her [facility] hospitalist Dr. Providence Crosby primary  Mallory Rios is an 84 y.o. female.  HPI: Patient a 84 year old white female recently discharged from [facility] about 2 weeks ago for pneumonia anasarca she is been treated in a nursing home became more short of breath over the last few days brought to the emergency room noted to have a nontoxic pneumonia on x-ray with effusion she was placed on BiPAP patient also found to be rapid atrial fibrillation.  Complain of generalized fatigue weakness has had multiple cardiac stents denies any chest pain still short of breath slightly they are disoriented when she was examined  Past Medical History:  Diagnosis Date  . A-fib ([place])   . Cancer ([place])   . CHF (congestive heart failure) ([place])   . Hypertension   . MI (myocardial infarction) [facility])     Past Surgical History:  Procedure Laterality Date  . ABDOMINAL HYSTERECTOMY    . CARDIAC SURGERY     stents x6    Family History  Problem Relation Age of Onset  . CAD Neg Hx   . Diabetes Neg Hx   . Hypertension Neg Hx     Social History:  reports that she has never smoked. She has never used smokeless tobacco. She reports that she does not drink alcohol or use drugs.  Allergies:  Allergies  Allergen Reactions  . Demerol [Meperidine]   . Other Other (See Comments)    darvocet   . Propoxyphene     Medications: I have reviewed the patient's current medications.  Results for orders placed or performed during the hospital encounter of 10/25/2017 (from the past 48 hour(s))  CBC with Differential     Status: Abnormal   Collection Time: 10/01/2017  6:33 AM  Result Value Ref Range   WBC 11.9 (H) 3.6 - 11.0 K/uL   RBC 3.87 3.80 - 5.20 MIL/uL   Hemoglobin 8.6 (L) 12.0 - 16.0 g/dL   HCT 27.7 (L) 35.0 - 47.0 %   MCV 71.6 (L) 80.0 - 100.0 fL   MCH 22.1 (L) 26.0 - 34.0 pg   MCHC 30.9 (L) 32.0 - 36.0 g/dL   RDW 23.0 (H)  11.5 - 14.5 %   Platelets 469 (H) 150 - 440 K/uL   Neutrophils Relative % 84 %   Neutro Abs 10.0 (H) 1.4 - 6.5 K/uL   Lymphocytes Relative 4 %   Lymphs Abs 0.5 (L) 1.0 - 3.6 K/uL   Monocytes Relative 11 %   Monocytes Absolute 1.3 (H) 0.2 - 0.9 K/uL   Eosinophils Relative 0 %   Eosinophils Absolute 0.1 0 - 0.7 K/uL   Basophils Relative 1 %   Basophils Absolute 0.1 0 - 0.1 K/uL    Comment: Performed at [facility], [address] [place], [place] [address]  Basic metabolic panel     Status: Abnormal   Collection Time: 10/12/2017  6:33 AM  Result Value Ref Range   Sodium 141 135 - 145 mmol/L   Potassium 4.3 3.5 - 5.1 mmol/L   Chloride 100 98 - 111 mmol/L   CO2 30 22 - 32 mmol/L   Glucose, Bld 132 (H) 70 - 99 mg/dL   BUN 30 (H) 8 - 23 mg/dL   Creatinine, Ser 0.72 0.44 - 1.00 mg/dL   Calcium 8.6 (L) 8.9 - 10.3 mg/dL   GFR calc non Af Amer >60 >60 mL/min   GFR calc Af Amer >  60 >60 mL/min    Comment: (NOTE) The eGFR has been calculated using the CKD EPI equation. This calculation has not been validated in all clinical situations. eGFR's persistently <60 mL/min signify possible Chronic Kidney Disease.    Anion gap 11 5 - 15    Comment: Performed at [facility], [address] [place], [place] [address]  Brain natriuretic peptide     Status: Abnormal   Collection Time: 10/11/2017  6:33 AM  Result Value Ref Range   B Natriuretic Peptide 899.0 (H) 0.0 - 100.0 pg/mL    Comment: Performed at [facility], [address] [place], [place] [address]  Troponin I     Status: Abnormal   Collection Time: 10/26/2017  6:33 AM  Result Value Ref Range   Troponin I 0.54 (HH) <0.03 ng/mL    Comment: CRITICAL RESULT CALLED TO, READ BACK BY AND VERIFIED WITH BILL SMITH ON 10/12/2017 AT 0730 BY JML/DAS Performed at [facility], [address], [place], [place] [address]   Blood Culture (routine x 2)     Status: None (Preliminary result)   Collection Time:  09/29/2017  7:36 AM  Result Value Ref Range   Specimen Description BLOOD RIGHT FA    Special Requests      BOTTLES DRAWN AEROBIC AND ANAEROBIC Blood Culture adequate volume   Culture      NO GROWTH < 24 HOURS Performed at [facility], [address], [place], [place] [address]    Report Status PENDING   Blood Culture (routine x 2)     Status: None (Preliminary result)   Collection Time: 10/13/2017  7:36 AM  Result Value Ref Range   Specimen Description BLOOD RIGHT AC    Special Requests      BOTTLES DRAWN AEROBIC AND ANAEROBIC Blood Culture results may not be optimal due to an inadequate volume of blood received in culture bottles   Culture      NO GROWTH < 24 HOURS Performed at [facility], [address] [place], [place] [address]    Report Status PENDING   Lactic acid, plasma     Status: None   Collection Time: 10/09/2017  7:36 AM  Result Value Ref Range   Lactic Acid, Venous 1.9 0.5 - 1.9 mmol/L    Comment: Performed at [facility], [address] [place], [place] [address]  APTT     Status: None   Collection Time: 09/26/2017  7:42 AM  Result Value Ref Range   aPTT 28 24 - 36 seconds    Comment: Performed at [facility], [address] [place], [place] [address]  Protime-INR     Status: None   Collection Time: 10/17/2017  7:42 AM  Result Value Ref Range   Prothrombin Time 15.2 11.4 - 15.2 seconds   INR 1.21     Comment: Performed at [facility], [address] [place], [place] [address]  Body fluid cell count with differential     Status: Abnormal   Collection Time: 09/27/2017 10:08 AM  Result Value Ref Range   Fluid Type-FCT CYTOPLEU    Color, Fluid YELLOW YELLOW   Appearance, Fluid CLEAR (A) CLEAR   WBC, Fluid 30 cu mm   Neutrophil Count, Fluid 54 %   Lymphs, Fluid 21 %   Monocyte-Macrophage-Serous Fluid 24 %   Eos, Fluid 0 %   Other Cells, Fluid 1 %    Comment: BASOPHIL Performed at [facility],  [place], [place] [address]   Body fluid culture     Status: None (Preliminary result)   Collection Time: 10/01/2017 10:08 AM  Result Value Ref Range   Specimen Description      PLEURAL Performed at [facility], [address] [place], [place] [address]    Special Requests      NONE Performed at [facility], [place], [place] [address]    Gram Stain      FEW WBC PRESENT,BOTH PMN AND MONONUCLEAR NO ORGANISMS SEEN    Culture      NO GROWTH < 24 HOURS Performed at [facility], [place] [address] [place], [place] [address]    Report Status PENDING   Albumin, pleural or peritoneal fluid     Status: None   Collection Time: 10/23/2017 10:08 AM  Result Value Ref Range   Albumin, Fluid 1.4 g/dL    Comment: (NOTE) No normal range established for this test Results should be evaluated in conjunction with serum values    Fluid Type-FALB CYTOPLEU     Comment: Performed at [facility], [address] [place], [place] [address]  Protein, pleural or peritoneal fluid     Status: None   Collection Time: 10/23/2017 10:08 AM  Result Value Ref Range   Total protein, fluid <3.0 g/dL    Comment: (NOTE) No normal range established for this test Results should be evaluated in conjunction with serum values    Fluid Type-FTP CYTOPLEU     Comment: Performed at [facility], [address] [place], [place] [address]  Glucose, pleural or peritoneal fluid     Status: None   Collection Time: 09/30/2017 10:08 AM  Result Value Ref Range   Glucose, Fluid 128 mg/dL    Comment: (NOTE) No normal range established for this test Results should be evaluated in conjunction with serum values    Fluid Type-FGLU CYTOPLEU     Comment: Performed at [facility], [address] [place], [place] [address]  Protein, body fluid (other)     Status: None   Collection Time: 10/06/2017 10:08 AM  Result Value Ref Range   Total Protein, Body  Fluid Other 2.5 g/dL    Comment: (NOTE) ________________________________________________________ :  Peritoneal  :       Pleural          :   Synovial     : :______________:________________________:________________: :              : Transudate :  Exudate  :                : :______________:____________:___________:________________: :  Not Estab.  :   <3 g/dL  :  >3 g/dL  :    <2.5 g/dL   : :______________:____________:___________:________________: The method performance specifications have not been established for this test in body fluid. The test result should be integrated into the clinical context for interpretation. The method performance specifications have not been established for this test in body fluid.  The test result should be integrated into the clinical context for interpretation. Performed At: [facility] [place], [place] [id number] Rush Farmer MD [id number]    Source of Sample PLEURAL     Comment: Performed at [facility], [address] [place], [place] [address]  Pathologist smear review     Status: None   Collection Time: 10/09/2017 10:08 AM  Result Value  Ref Range   Path Review      Cytospin slide of right pleural fluid reviewed. Cellularity is low, with neutrophils, macrophages, lymphocytes, and reactive mesothelial cells. No malignant cells identified.    Comment: Reviewed by Lemmie Evens. Dicie Beam, MD. Performed at [facility], [address] [place], [place] [address]   Troponin I     Status: Abnormal   Collection Time: 10/04/2017 10:31 AM  Result Value Ref Range   Troponin I 0.97 (HH) <0.03 ng/mL    Comment: CRITICAL VALUE NOTED. VALUE IS CONSISTENT WITH PREVIOUSLY REPORTED/CALLED VALUE DAS Performed at [facility], [address] [place], [place] [address]   Troponin I     Status: Abnormal   Collection Time: 10/23/2017  2:37 PM  Result Value Ref Range   Troponin I 2.41 (HH) <0.03 ng/mL    Comment:  CRITICAL VALUE NOTED. VALUE IS CONSISTENT WITH PREVIOUSLY REPORTED/CALLED VALUE DAS Performed at [facility], [place], [place] [address]   Heparin level (unfractionated)     Status: Abnormal   Collection Time: 10/22/2017  4:00 PM  Result Value Ref Range   Heparin Unfractionated <0.10 (L) 0.30 - 0.70 IU/mL    Comment: RESULT REPEATED AND VERIFIED (NOTE) If heparin results are below expected values, and patient dosage has  been confirmed, suggest follow up testing of antithrombin III levels. Performed at [facility], [address] [place], [place] [address]   Troponin I     Status: Abnormal   Collection Time: 10/22/2017  7:08 PM  Result Value Ref Range   Troponin I 4.81 (HH) <0.03 ng/mL    Comment: CRITICAL RESULT CALLED TO, READ BACK BY AND VERIFIED WITH Marshell Levan AT 2040 10/16/2017.  TFK Performed at [facility], [address] [place], [place] [address]   Lipid panel     Status: Abnormal   Collection Time: 09/29/2017  7:08 PM  Result Value Ref Range   Cholesterol 85 0 - 200 mg/dL   Triglycerides 50 <150 mg/dL   HDL 25 (L) >40 mg/dL   Total CHOL/HDL Ratio 3.4 RATIO   VLDL 10 0 - 40 mg/dL   LDL Cholesterol 50 0 - 99 mg/dL    Comment:        Total Cholesterol/HDL:CHD Risk Coronary Heart Disease Risk Table                     Men   Women  1/2 Average Risk   3.4   3.3  Average Risk       5.0   4.4  2 X Average Risk   9.6   7.1  3 X Average Risk  23.4   11.0        Use the calculated Patient Ratio above and the CHD Risk Table to determine the patient's CHD Risk.        ATP III CLASSIFICATION (LDL):  <100     mg/dL   Optimal  100-129  mg/dL   Near or Above                    Optimal  130-159  mg/dL   Borderline  160-189  mg/dL   High  >190     mg/dL   Very High Performed at [facility], [address], [place], [place] [address]   Basic metabolic panel     Status: Abnormal   Collection Time: 10/18/2017  2:21 AM   Result  Value Ref Range   Sodium 140 135 - 145 mmol/L   Potassium 4.8 3.5 - 5.1 mmol/L   Chloride 100 98 - 111 mmol/L   CO2 24 22 - 32 mmol/L   Glucose, Bld 175 (H) 70 - 99 mg/dL   BUN 38 (H) 8 - 23 mg/dL   Creatinine, Ser 1.09 (H) 0.44 - 1.00 mg/dL   Calcium 8.3 (L) 8.9 - 10.3 mg/dL   GFR calc non Af Amer 44 (L) >60 mL/min   GFR calc Af Amer 51 (L) >60 mL/min    Comment: (NOTE) The eGFR has been calculated using the CKD EPI equation. This calculation has not been validated in all clinical situations. eGFR's persistently <60 mL/min signify possible Chronic Kidney Disease.    Anion gap 16 (H) 5 - 15    Comment: Performed at [facility], [address] [place], [place] [address]  CBC     Status: Abnormal   Collection Time: 10/06/2017  2:21 AM  Result Value Ref Range   WBC 15.2 (H) 3.6 - 11.0 K/uL   RBC 4.19 3.80 - 5.20 MIL/uL   Hemoglobin 9.2 (L) 12.0 - 16.0 g/dL   HCT 30.9 (L) 35.0 - 47.0 %   MCV 73.8 (L) 80.0 - 100.0 fL   MCH 21.9 (L) 26.0 - 34.0 pg   MCHC 29.7 (L) 32.0 - 36.0 g/dL   RDW 24.9 (H) 11.5 - 14.5 %   Platelets 547 (H) 150 - 440 K/uL    Comment: Performed at [facility], [place], [place] [address]  Heparin level (unfractionated)     Status: Abnormal   Collection Time: 09/28/2017  2:21 AM  Result Value Ref Range   Heparin Unfractionated 0.18 (L) 0.30 - 0.70 IU/mL    Comment: (NOTE) If heparin results are below expected values, and patient dosage has  been confirmed, suggest follow up testing of antithrombin III levels. Performed at [facility], [address] [place], [place] [address]     Dg Chest 1 View  Result Date: 10/06/2017 CLINICAL DATA:  Status post thoracentesis EXAM: CHEST  1 VIEW COMPARISON:  Chest radiograph and chest CT October 19, 2017 FINDINGS: There is no appreciable pneumothorax. There is only a small amount of residual pleural effusion following thoracentesis. There is atelectatic change in the  right base. On the left, there is a small effusion with left base atelectasis. Heart is enlarged with pulmonary vascularity normal. No adenopathy. There is aortic atherosclerosis. Bones are osteoporotic. IMPRESSION: No pneumothorax. Small residual right pleural effusion after thoracentesis. Right base atelectasis. There is a slightly larger left pleural effusion, stable, with left base atelectasis. Stable cardiomegaly. There is aortic atherosclerosis. Aortic Atherosclerosis (ICD10-I70.0). Electronically Signed   By: Lowella Grip III M.D.   On: 10/22/2017 10:24   Ct Chest Wo Contrast  Result Date: 10/10/2017 CLINICAL DATA:  Shortness of breath, pleural effusion, pneumonia EXAM: CT CHEST WITHOUT CONTRAST TECHNIQUE: Multidetector CT imaging of the chest was performed following the standard protocol without IV contrast. COMPARISON:  Chest x-ray earlier today FINDINGS: Cardiovascular: Diffuse aortic and coronary artery calcifications. No evidence of aortic aneurysm. Cardiomegaly. Mediastinum/Nodes: Scattered small and borderline sized mediastinal lymph nodes. No adenopathy. Moderate-sized hiatal hernia. Lungs/Pleura: Large right pleural effusion and small left pleural effusion. Compressive atelectasis in the left lower lobe. Airspace disease within the right lower lobe likely reflects compressive atelectasis although pneumonia cannot be completely excluded. Upper Abdomen: Imaging into the upper abdomen shows no acute findings.  Heavily calcified abdominal aorta. Musculoskeletal: Chest wall soft tissues are unremarkable. No acute bony abnormality. IMPRESSION: Large right pleural effusion and small left pleural effusion. Probable compressive atelectasis in both lower lobes, right worse than left. Pneumonia cannot be completely excluded in the right lower lobe. Cardiomegaly.  Heavily calcified coronary arteries and aorta. Electronically Signed   By: Rolm Baptise M.D.   On: 10/11/2017 07:32   Dg Chest Port 1  View  Result Date: 10/11/2017 CLINICAL DATA:  Respiratory distress. EXAM: PORTABLE CHEST 1 VIEW COMPARISON:  07/23/2017 FINDINGS: Cardiac enlargement. Moderate to large right pleural effusion with basilar consolidation. This may indicate pneumonia. Left lung is clear. No pneumothorax. Calcification of the aorta. Mediastinal contours appear intact. IMPRESSION: Moderate to large right pleural effusion with consolidation in the right lung base possibly representing pneumonia. Cardiac enlargement. Electronically Signed   By: Lucienne Capers M.D.   On: 10/06/2017 06:37   US Thoracentesis Asp Pleural Space W/img Guide  Result Date: 10/04/2017 INDICATION: Right pleural effusion EXAM: ULTRASOUND GUIDED RIGHT THORACENTESIS MEDICATIONS: None. COMPLICATIONS: None immediate. PROCEDURE: An ultrasound guided thoracentesis was thoroughly discussed with the patient and questions answered. The benefits, risks, alternatives and complications were also discussed. The patient understands and wishes to proceed with the procedure. Written consent was obtained. Ultrasound was performed to localize and mark an adequate pocket of fluid in the right chest. The area was then prepped and draped in the normal sterile fashion. 1% Lidocaine was used for local anesthesia. Under ultrasound guidance a 6 Fr Safe-T-Centesis catheter was introduced. Thoracentesis was performed. The catheter was removed and a dressing applied. FINDINGS: A total of approximately 1.2 L of clear yellow fluid was removed. Samples were sent to the laboratory as requested by the clinical team. IMPRESSION: Successful ultrasound guided right thoracentesis yielding 1.2 L of pleural fluid. Electronically Signed   By: Marybelle Killings M.D.   On: 10/06/2017 10:36    Review of Systems  Constitutional: Positive for diaphoresis and malaise/fatigue.  HENT: Positive for congestion.   Eyes: Negative.   Respiratory: Positive for shortness of breath.   Cardiovascular: Positive  for chest pain, palpitations and claudication.  Gastrointestinal: Positive for heartburn.  Genitourinary: Negative.   Musculoskeletal: Positive for myalgias.  Skin: Negative.   Neurological: Positive for dizziness and weakness.  Endo/Heme/Allergies: Negative.   Psychiatric/Behavioral: Negative.    Blood pressure 117/83, pulse (!) 59, temperature 98.3 F (36.8 C), temperature source Axillary, resp. rate (!) 22, height _0  (1.422 m), weight 114 lb 11.2 oz (52 kg), SpO2 98 %. Physical Exam  Nursing note and vitals reviewed. Constitutional: She appears well-nourished. She appears lethargic.  HENT:  Head: Normocephalic and atraumatic.  Eyes: Pupils are equal, round, and reactive to light. Conjunctivae and EOM are normal.  Neck: Normal range of motion. Neck supple.  Cardiovascular: Normal rate, regular rhythm and normal heart sounds.  Respiratory: Effort normal and breath sounds normal.  GI: Soft. Bowel sounds are normal.  Musculoskeletal: Normal range of motion.  Neurological: She has normal reflexes. She appears lethargic.    Assessment/Plan: Atrial fibrillation Congestive heart failure Hypertension History of myocardial infarction Right-sided pleural effusion Respiratory failure Coronary disease History of lung cancer . Plan agree with admit to telemetry Rule out for microinfarction Follow-up cardiac enzymes and troponins Probably had demand ischemia Recommend IV anticoagulation with heparin Supplemental oxygen as necessary Consider diuretic therapy versus thoracentesis for pleural effusion Echocardiogram will be helpful for further assessment evaluation  Dwayne D Callwood 10/23/2017, 12:33 PM

## 2017-10-20 NOTE — Care Management (Signed)
Patient transferred to icu

## 2017-10-20 NOTE — Progress Notes (Signed)
Notified Dana NP about not being able to obtain assessment of dorsalis pedis pulse's bilaterally (doppler or palpation). Was able to doppler bilateral popliteal pulses. See flowsheet for documentation.

## 2017-10-20 NOTE — Progress Notes (Addendum)
The patient is status posterior angiogram and vascular procedures. She is very lethargic and hypoxic at 68% on nonrebreather oxygen.  She is in worsening respiratory failure and also has bradycardia.  *Acute respiratory failure with hypoxia due to CHF and pleural effusion. Put on BiPAP, NEB. CT scan on chest also appears to have large right-sided pleural effusion with some atelectasis. S/P  Thoracentesis with 1.2 L fluid draw. Transfer to stepdown unit.  *Large right-sided pleural effusion Most likely parapneumonic as she was treated for pneumonia 2 weeks ago. S/P  Thoracentesis with 1.2 L fluid draw.  * Ac onChronic diastolic congestive heart failure Continue IV Lasix and CHF protocol.  *Non-STEMI, troponin is up to 4.81. Continue heparin drip. Follow-up cardiology consult  *Chronic microcytic anemia Once her acute issues are resolved, she may need to have iron check and supplemental iron.  *History of CAD Continue aspirin, Lipitor.  History of chronic A. fib.  Rate is controlled. Continue Lopressor and heparin drip.  Acute ischemia to the right lower extremity. Status posterior angiogram and vascular procedures.  Continue heparin drip.  I called Dr. Mortimer Fries for transfer to stepdown unit.  But nobody answered the phone.  I discussed with the patient's husband, RNs, rapid response nurse and the nurse supervisor.  Critical time spent about 40 minutes.

## 2017-10-20 NOTE — Progress Notes (Addendum)
ANTICOAGULATION CONSULT NOTE - Initial Consult  Pharmacy Consult for heparin Indication: chest pain/ACS  Allergies  Allergen Reactions  . Demerol [Meperidine]   . Other Other (See Comments)    darvocet   . Propoxyphene     Patient Measurements: Height: 4\' 8"  (142.2 cm) Weight: 114 lb 11.2 oz (52 kg) IBW/kg (Calculated) : 36.3 Heparin Dosing Weight: 51 kg  Vital Signs: Temp: 97.7 F (36.5 C) (07/25 2113) Temp Source: Axillary (07/25 2113) BP: 115/62 (07/25 2300) Pulse Rate: 61 (07/25 2300)  Labs: Recent Labs    10/18/2017 0633 10/24/2017 0742 10/23/2017 1031 10/02/2017 1437 10/12/2017 1600 10/04/2017 1908 10/07/2017 0221 10/09/2017 1743 10/20/17 2248  HGB 8.6*  --   --   --   --   --  9.2*  --   --   HCT 27.7*  --   --   --   --   --  30.9*  --   --   PLT 469*  --   --   --   --   --  547*  --   --   APTT  --  28  --   --   --   --   --   --   --   LABPROT  --  15.2  --   --   --   --   --   --   --   INR  --  1.21  --   --   --   --   --   --   --   HEPARINUNFRC  --   --   --   --  <0.10*  --  0.18*  --  0.48  CREATININE 0.72  --   --   --   --   --  1.09*  --   --   CKTOTAL  --   --   --   --   --   --   --  209  --   TROPONINI 0.54*  --  0.97* 2.41*  --  4.81*  --   --   --     Estimated Creatinine Clearance: 24.5 mL/min (A) (by C-G formula based on SCr of 1.09 mg/dL (H)).   Medical History: Past Medical History:  Diagnosis Date  . A-fib ([place])   . Cancer ([place])   . CHF (congestive heart failure) ([place])   . Hypertension   . MI (myocardial infarction) ([place])     Medications:  Scheduled:  . aspirin EC  81 mg Oral Daily  . atorvastatin  40 mg Oral q1800  . famotidine  10 mg Oral BID  . ferrous sulfate  325 mg Oral Q breakfast  . furosemide  20 mg Intravenous Q12H  . metoprolol tartrate  12.5 mg Oral BID  . mirtazapine  7.5 mg Oral QHS  . naLOXone (NARCAN)  injection  0.4 mg Intravenous Once  . sodium chloride flush  3 mL Intravenous Q12H  . sodium chloride flush   3 mL Intravenous Q12H  . vitamin C  500 mg Oral Daily  . vitamin E  400 Units Oral Daily     Patient admitted s/t SOB and right-sided CP. EKG shows afib w/ ICVD placement Initial trop 0.54, patient is not on PTA anticoagulation.  Goal of Therapy:  Heparin level 0.3-0.7 units/ml Monitor platelets by anticoagulation protocol: Yes   Assessment/Plan:  Heparin resumed s/p angiogram pending cardiology evaluation. Dr. Lucky Cowboy is OK with normal goal and  bolus.   Heparin resumed at previous rate. Will check a HL in 8 hours.   7/25 PM heparin level 0.48. Continue current regimen. Recheck with AM labs to confirm.  7/26 PM heparin level 0.39. Continue current regimen. Recheck with tomorrow AM labs to confirm.  Ulice Dash, PharmD Clinical Pharmacist  10/20/2017 11:10 PM

## 2017-10-20 NOTE — Progress Notes (Signed)
Dr. Jannifer Franklin was notified of patient's c/o nausea, PRN Zofran was administered per ordered. Will continue to monitor.

## 2017-10-20 NOTE — Progress Notes (Addendum)
Patient to SPR preprocedure with marginal oxygen saturation on 5L nasal cannula, lethargic, arousable to voice, confused to situation. Was placed on nonrebreather to maintain oxygen saturation preprocedure. Husband called for procedure consent per MD. Post-procedure patient remains lethargic, placed on non-rebreather for marginal oxygen saturation, patient began arousing and refusing to keep non-rebreather mask in place. Without mask, patient oxygen saturation dropping to 80s. Dr Bridgett Larsson paged with no callback. Report given to [facility], returning patient to 2A room while continuing to replace nonrebreather. Upon arrival to 2A room, patient continued to be combative and attempt to remove nonrebreather with correlating desaturation and bradycardia. Per 2A RN, rapid response called, team arrived and took over care for patient.

## 2017-10-20 NOTE — Progress Notes (Addendum)
Patient has no pulse per doppler in her right dorsalis ped.  She has a doppler pulse in the post tib. At the level of her ankle her foot goes from warm on her leg to cold.  She has extreme pain in the right foot. She cried out when the foot is touched to remove her sock. Heparin was started during the night. Pulse ox is 88% on 5 L [place], change to NRB mask  10 L.  Sats increased to 94%. Dr. Bridgett Larsson paged.

## 2017-10-20 NOTE — Progress Notes (Signed)
Dr. Mortimer Fries notified of patient's lactic acid result 8.2  No new orders received. Will continue to monitor.

## 2017-10-20 NOTE — Progress Notes (Addendum)
ANTICOAGULATION CONSULT NOTE - Initial Consult  Pharmacy Consult for heparin Indication: chest pain/ACS  Allergies  Allergen Reactions  . Demerol [Meperidine]   . Other Other (See Comments)  . Propoxyphene     Patient Measurements: Height: 4\' 8"  (142.2 cm) Weight: 114 lb 11.2 oz (52 kg) IBW/kg (Calculated) : 36.3 Heparin Dosing Weight: 51 kg  Vital Signs: Temp: 98.7 F (37.1 C) (07/24 2045) Temp Source: Oral (07/24 2045) BP: 96/64 (07/25 0138) Pulse Rate: 80 (07/25 0138)  Labs: Recent Labs    10/26/2017 0086 10/13/2017 0742 10/15/2017 1031 09/30/2017 1437 10/24/2017 1600 09/27/2017 1908 10/12/2017 0221  HGB 8.6*  --   --   --   --   --  9.2*  HCT 27.7*  --   --   --   --   --  30.9*  PLT 469*  --   --   --   --   --  547*  APTT  --  28  --   --   --   --   --   LABPROT  --  15.2  --   --   --   --   --   INR  --  1.21  --   --   --   --   --   HEPARINUNFRC  --   --   --   --  <0.10*  --  0.18*  CREATININE 0.72  --   --   --   --   --  1.09*  TROPONINI 0.54*  --  0.97* 2.41*  --  4.81*  --     Estimated Creatinine Clearance: 24.5 mL/min (A) (by C-G formula based on SCr of 1.09 mg/dL (H)).   Medical History: Past Medical History:  Diagnosis Date  . A-fib ([place])   . Cancer ([place])   . CHF (congestive heart failure) ([place])   . Hypertension   . MI (myocardial infarction) ([place])     Medications:  Scheduled:  . aspirin EC  81 mg Oral Daily  . atorvastatin  40 mg Oral q1800  . famotidine  10 mg Oral BID  . ferrous sulfate  325 mg Oral Q breakfast  . furosemide  20 mg Intravenous Q12H  . heparin  1,500 Units Intravenous Once  . metoprolol tartrate  12.5 mg Oral BID  . mirtazapine  7.5 mg Oral QHS  . morphine      . vitamin C  500 mg Oral Daily  . vitamin E  400 Units Oral Daily    Assessment: Patient admitted s/t SOB and right-sided CP. EKG shows afib w/ ICVD placement Initial trop 0.54, patient is not on PTA anticoagulation.  Heparin drip at 600 units/hr 7/24  16:00 Heparin level resulted at <0.10  Goal of Therapy:  Heparin level 0.3-0.7 units/ml Monitor platelets by anticoagulation protocol: Yes   Plan:  Will order Heparin 1500 units IV bolus and increase drip rate to 800 units/hr. Will check next Heparin level in 8 hours. Daily CBC while on Heparin.  7/25 0230 heparin level 0.18. 1500 unit bolus and increase rate to 1000 units/hr. Recheck in 8 hours.  Sim Boast, PharmD, BCPS  10/03/2017 3:55 AM

## 2017-10-20 NOTE — Progress Notes (Addendum)
[facility] [place] NAME: Mallory Rios    MR#:  [id number]  DATE OF BIRTH:  1927/06/29  SUBJECTIVE:  CHIEF COMPLAINT:   Chief Complaint  Patient presents with  . Shortness of Breath   The patient complains of right foot pain since early this morning.  The right foot is cold and no pedal pulses per RN.  The patient is on heparin drip for elevated troponin. REVIEW OF SYSTEMS:  Review of Systems  Constitutional: Positive for malaise/fatigue. Negative for chills and fever.  HENT: Negative for hearing loss and sore throat.   Eyes: Negative for blurred vision and double vision.  Respiratory: Positive for cough, sputum production and shortness of breath. Negative for hemoptysis, wheezing and stridor.   Cardiovascular: Negative for chest pain, palpitations, orthopnea and leg swelling.  Gastrointestinal: Negative for abdominal pain, blood in stool, diarrhea, melena, nausea and vomiting.  Genitourinary: Negative for dysuria, flank pain and hematuria.  Musculoskeletal: Negative for back pain and joint pain.       Right foot pain, no calf tenderness.  Neurological: Negative for dizziness, sensory change, focal weakness, seizures, loss of consciousness, weakness and headaches.  Endo/Heme/Allergies: Negative for polydipsia.  Psychiatric/Behavioral: Negative for depression. The patient is nervous/anxious.     DRUG ALLERGIES:   Allergies  Allergen Reactions  . Demerol [Meperidine]   . Other Other (See Comments)    darvocet   . Propoxyphene    VITALS:  Blood pressure 117/83, pulse (!) 59, temperature 98.3 F (36.8 C), temperature source Axillary, resp. rate (!) 22, height 4\' 8"  (1.422 m), weight 114 lb 11.2 oz (52 kg), SpO2 91 %. PHYSICAL EXAMINATION:  Physical Exam  HENT:  Head: Normocephalic.  Eyes: Pupils are equal, round, and reactive to light. Conjunctivae and EOM are normal. No scleral icterus.  Neck: Normal range of motion. Neck  supple. No JVD present. No tracheal deviation present.  Cardiovascular: Normal rate and normal heart sounds. Exam reveals no gallop.  No murmur heard. Irregular rate and rhythm.  No pedal pulses on right foot.  Pulmonary/Chest: Effort normal. No respiratory distress. She has no wheezes. She has rales.  Abdominal: Soft. Bowel sounds are normal. She exhibits no distension. There is no tenderness. There is no rebound.  Musculoskeletal: Normal range of motion. She exhibits no edema or tenderness.  It is cold on right foot and lower part of leg  Neurological: She is alert. No cranial nerve deficit.  Skin: No rash noted. No erythema.   LABORATORY PANEL:  Female CBC Recent Labs  Lab 10/19/2017 0221  WBC 15.2*  HGB 9.2*  HCT 30.9*  PLT 547*   ------------------------------------------------------------------------------------------------------------------ Chemistries  Recent Labs  Lab 10/20/17 0221  NA 140  K 4.8  CL 100  CO2 24  GLUCOSE 175*  BUN 38*  CREATININE 1.09*  CALCIUM 8.3*   RADIOLOGY:  No results found. ASSESSMENT AND PLAN:  Mallory Rios  is a 84 y.o. female with a known history of A. fib, CHF, hypertension, myocardial infarction, recent admission to [facility] 2 weeks ago for pneumonia and anasarca-[facility]  She was more short of breath worsening for the last few days, so was sent back to emergency room.  *Acute respiratory failure with hypoxia due to CHF and pleural effusion. As per discharge summary from [facility] 2 weeks ago, patient was discharged on 2 L oxygen after treated for pneumonia. She was on BiPAP, changed to nonrebreather  and then oxygen by nasal cannula at 5 L.   CT scan on chest also appears to have large right-sided pleural effusion with some atelectasis. S/P  Thoracentesis with 1.2 L fluid draw.  *Large right-sided pleural effusion Most likely parapneumonic as she was treated for pneumonia 2 weeks ago. S/P   Thoracentesis with 1.2 L fluid draw.  * Ac on Chronic diastolic congestive heart failure Continue IV Lasix and CHF protocol.  *Non-STEMI, troponin is up to 4.81. Continue heparin drip. Follow-up cardiology consult  *Chronic microcytic anemia Once her acute issues are resolved, she may need to have iron check and supplemental iron.  *History of CAD Continue aspirin, Lipitor.  History of chronic A. fib.  Rate is controlled. Continue Lopressor and heparin drip.  Acute ischemia to the right lower extremity. Per Dr. Lucky Cowboy, this is almost certainly a cardiac embolus secondary to A. fib.  Continue heparin drip.  Follow-up with Dr. Lucky Cowboy for angiogram and possible revascularization.  I discussed with Dr. Lucky Cowboy.  The patient has very poor prognosis, palliative care consult. All the records are reviewed and case discussed with Care Management/Social Worker. Management plans discussed with the patient, and her husband and they are in agreement.  CODE STATUS: DNR  TOTAL TIME TAKING CARE OF THIS PATIENT: 46 minutes.   More than 50% of the time was spent in counseling/coordination of care: YES  POSSIBLE D/C IN 3 DAYS, DEPENDING ON CLINICAL CONDITION.   [name] M.D on 10/02/2017 at 1:38 PM  Between 7am to 6pm - Pager - [phone]  After 6pm go to [email or web address] - [facility]

## 2017-10-20 NOTE — Progress Notes (Signed)
* [facility]      Date: 10/19/2017  MRN# [id number] NYKAYLA MARCELLI May 12, 1927   Mallory Rios is a 84 y.o. old female seen in follow up for chief complaint of  Chief Complaint  Patient presents with  . Shortness of Breath     HPI:  Initially Patient admitted for SOB, pleural effusion Subsequently developed acute lower ext ischemic-vasc surgery consulted-thrombectomy/angioplasty transferred to ICU for progressive resp failure,severe hypoxia Now on biPAP, severe distress It seems patient received pain meds for procedure and woke up briefly from Narcan administration  Patient is DNR/DNI  Medication:    Current Facility-Administered Medications:  .  acetaminophen (TYLENOL) tablet 650 mg, 650 mg, Oral, Q6H PRN, Algernon Huxley, MD, 650 mg at 10/04/2017 2138 .  aspirin EC tablet 81 mg, 81 mg, Oral, Daily, Algernon Huxley, MD, 81 mg at 10/12/2017 0942 .  atorvastatin (LIPITOR) tablet 40 mg, 40 mg, Oral, q1800, Algernon Huxley, MD, 40 mg at 09/28/2017 2138 .  docusate sodium (COLACE) capsule 100 mg, 100 mg, Oral, BID PRN, Algernon Huxley, MD .  famotidine (PEPCID) tablet 10 mg, 10 mg, Oral, BID, Dew, Erskine Squibb, MD, 10 mg at 10/09/2017 0943 .  ferrous sulfate tablet 325 mg, 325 mg, Oral, Q breakfast, Dew, Erskine Squibb, MD .  furosemide (LASIX) injection 20 mg, 20 mg, Intravenous, Q12H, Dew, Erskine Squibb, MD, 20 mg at 10/15/2017 0903 .  heparin ADULT infusion 100 units/mL (25000 units/254mL sodium chloride 0.45%), 1,000 Units/hr, Intravenous, Continuous, Dew, Erskine Squibb, MD, Last Rate: 10 mL/hr at 10/26/2017 1430, 1,000 Units/hr at 10/07/2017 1430 .  ipratropium-albuterol (DUONEB) 0.5-2.5 (3) MG/3ML nebulizer solution 3 mL, 3 mL, Nebulization, Q4H PRN, Algernon Huxley, MD, 3 mL at 10/05/2017 0905 .  metoprolol tartrate (LOPRESSOR) tablet 12.5 mg, 12.5 mg, Oral, BID, Lucky Cowboy, Erskine Squibb, MD, 12.5 mg at 10/22/2017 0944 .  mirtazapine (REMERON) tablet 7.5 mg, 7.5 mg, Oral, QHS, Dew, Erskine Squibb, MD, 7.5 mg at 10/26/2017  2139 .  morphine 2 MG/ML injection 1-2 mg, 1-2 mg, Intravenous, Q4H PRN, Algernon Huxley, MD, 1 mg at 10/22/2017 0936 .  naloxone [facility]) injection 0.4 mg, 0.4 mg, Intravenous, Once, [name], MD .  ondansetron [facility]) injection 4 mg, 4 mg, Intravenous, Q6H PRN, Algernon Huxley, MD, 4 mg at 09/27/2017 0904 .  oxyCODONE-acetaminophen (PERCOCET/ROXICET) 5-325 MG per tablet 1 tablet, 1 tablet, Oral, Q6H PRN, Lucky Cowboy, Erskine Squibb, MD .  sodium chloride flush (NS) 0.9 % injection 3 mL, 3 mL, Intravenous, Q12H, Dew, Jason S, MD .  sodium chloride flush (NS) 0.9 % injection 3 mL, 3 mL, Intravenous, Q12H, Dew, Erskine Squibb, MD, 3 mL at 10/17/2017 0916 .  sodium chloride flush (NS) 0.9 % injection 3 mL, 3 mL, Intravenous, PRN, Dew, Erskine Squibb, MD .  vitamin C (ASCORBIC ACID) tablet 500 mg, 500 mg, Oral, Daily, Dew, Erskine Squibb, MD, 500 mg at 10/14/2017 0944 .  vitamin E capsule 400 Units, 400 Units, Oral, Daily, Algernon Huxley, MD, 400 Units at 10/20/17 0944   Allergies:  Demerol [meperidine]; Other; and Propoxyphene   PHYSICAL EXAMINATION:  GENERAL:critically ill appearing, +resp distress, thin and frail HEAD: Normocephalic, atraumatic.  EYES: Pupils equal, round, reactive to light.  No scleral icterus.  MOUTH: Moist mucosal membrane. NECK: Supple. No thyromegaly. No nodules. No JVD.  PULMONARY: +rhonchi, +wheezing CARDIOVASCULAR: S1 and S2. Regular rate and rhythm. No murmurs, rubs, or gallops.  GASTROINTESTINAL: Soft, nontender, -distended. No masses. Positive  bowel sounds. No hepatosplenomegaly.  MUSCULOSKELETAL: LE cold to touch, weak pulses NEUROLOGIC: obtunded SKIN:intact,warm,dry  REVIEW OF SYSTEMS PATIENT IS UNABLE TO PROVIDE COMPLETE REVIEW OF SYSTEMS DUE TO SEVERE CRITICAL ILLNESS     LABORATORY PANEL:   CBC Recent Labs  Lab 10/12/2017 0221  WBC 15.2*  HGB 9.2*  HCT 30.9*  PLT 547*    ------------------------------------------------------------------------------------------------------------------  Chemistries  Recent Labs  Lab 10/03/2017 0221  NA 140  K 4.8  CL 100  CO2 24  GLUCOSE 175*  BUN 38*  CREATININE 1.09*  CALCIUM 8.3*   ------------------------------------------------------------------------------------------------------------------  Cardiac Enzymes Recent Labs  Lab 10/14/2017 1908  TROPONINI 4.81*       Assessment and Plan:  84 yo white female with multiple medical issues with severe Acute hypoxic respiratory failure  in setting Of severe ischemia to lower ext with CHF  Severe Hypoxic and Hypercapnic Respiratory Failure -biPAP as needed -narcan as needed  Acute CHF exacerbation -lasix as tolerated -Follow up cardiology  Follow up Vasc surgery recs -it seems that no other intervention can be done at this time    Critical Care Time devoted to patient care services described in this note is 37 minutes.   Overall, patient is critically ill, prognosis is guarded.   Recommend Palliative care consultation  Patient is DNR/DNI  Corrin Parker, M.D.  [facility]  Medical Director [place] Director [facility] Cardio-Pulmonary Department

## 2017-10-21 ENCOUNTER — Inpatient Hospital Stay: Payer: Medicare Other

## 2017-10-21 ENCOUNTER — Encounter: Payer: Self-pay | Admitting: Vascular Surgery

## 2017-10-21 DIAGNOSIS — R410 Disorientation, unspecified: Secondary | ICD-10-CM

## 2017-10-21 DIAGNOSIS — R0600 Dyspnea, unspecified: Secondary | ICD-10-CM

## 2017-10-21 DIAGNOSIS — Z7189 Other specified counseling: Secondary | ICD-10-CM

## 2017-10-21 DIAGNOSIS — I70211 Atherosclerosis of native arteries of extremities with intermittent claudication, right leg: Secondary | ICD-10-CM

## 2017-10-21 DIAGNOSIS — I214 Non-ST elevation (NSTEMI) myocardial infarction: Secondary | ICD-10-CM

## 2017-10-21 DIAGNOSIS — Z515 Encounter for palliative care: Secondary | ICD-10-CM

## 2017-10-21 LAB — CBC
HCT: 27.7 % — ABNORMAL LOW (ref 35.0–47.0)
Hemoglobin: 8.4 g/dL — ABNORMAL LOW (ref 12.0–16.0)
MCH: 22.3 pg — AB (ref 26.0–34.0)
MCHC: 30.5 g/dL — AB (ref 32.0–36.0)
MCV: 73.1 fL — ABNORMAL LOW (ref 80.0–100.0)
Platelets: 484 10*3/uL — ABNORMAL HIGH (ref 150–440)
RBC: 3.79 MIL/uL — ABNORMAL LOW (ref 3.80–5.20)
RDW: 24.2 % — AB (ref 11.5–14.5)
WBC: 33 10*3/uL — ABNORMAL HIGH (ref 3.6–11.0)

## 2017-10-21 LAB — BASIC METABOLIC PANEL
Anion gap: 15 (ref 5–15)
BUN: 64 mg/dL — AB (ref 8–23)
CALCIUM: 7.4 mg/dL — AB (ref 8.9–10.3)
CO2: 27 mmol/L (ref 22–32)
CREATININE: 2.43 mg/dL — AB (ref 0.44–1.00)
Chloride: 97 mmol/L — ABNORMAL LOW (ref 98–111)
GFR calc non Af Amer: 17 mL/min — ABNORMAL LOW (ref >60)
GFR, EST AFRICAN AMERICAN: 19 mL/min — AB (ref >60)
Glucose, Bld: 128 mg/dL — ABNORMAL HIGH (ref 70–99)
Potassium: 6 mmol/L — ABNORMAL HIGH (ref 3.5–5.1)
SODIUM: 139 mmol/L (ref 135–145)

## 2017-10-21 LAB — MAGNESIUM: Magnesium: 2.7 mg/dL — ABNORMAL HIGH (ref 1.7–2.4)

## 2017-10-21 LAB — HEPARIN LEVEL (UNFRACTIONATED): HEPARIN UNFRACTIONATED: 0.39 [IU]/mL (ref 0.30–0.70)

## 2017-10-21 LAB — GLUCOSE, CAPILLARY: Glucose-Capillary: 101 mg/dL — ABNORMAL HIGH (ref 70–99)

## 2017-10-21 MED ORDER — HYDROMORPHONE HCL 1 MG/ML IJ SOLN: 0.5000 mg | INTRAMUSCULAR | Status: DC | PRN

## 2017-10-21 MED ORDER — ONDANSETRON 4 MG PO TBDP
4.0000 mg | ORAL_TABLET | Freq: Four times a day (QID) | ORAL | Status: DC | PRN
  Filled 2017-10-21: qty 1

## 2017-10-21 MED ORDER — POLYVINYL ALCOHOL 1.4 % OP SOLN
1.0000 [drp] | Freq: Four times a day (QID) | OPHTHALMIC | Status: DC | PRN
  Filled 2017-10-21: qty 15

## 2017-10-21 MED ORDER — HYDROMORPHONE HCL 1 MG/ML IJ SOLN
0.5000 mg | INTRAMUSCULAR | Status: AC
  Administered 2017-10-21: 0.5 mg via INTRAVENOUS
  Filled 2017-10-21: qty 1

## 2017-10-21 MED ORDER — SODIUM BICARBONATE 8.4 % IV SOLN
50.0000 meq | Freq: Once | INTRAVENOUS | Status: AC
  Administered 2017-10-21: 50 meq via INTRAVENOUS

## 2017-10-21 MED ORDER — HYDROMORPHONE HCL 1 MG/ML IJ SOLN
1.0000 mg | INTRAMUSCULAR | Status: DC | PRN
  Administered 2017-10-21: 1 mg via INTRAVENOUS
  Filled 2017-10-21: qty 1

## 2017-10-21 MED ORDER — HALOPERIDOL LACTATE 5 MG/ML IJ SOLN: 0.5000 mg | INTRAMUSCULAR | Status: DC | PRN

## 2017-10-21 MED ORDER — GLYCOPYRROLATE 0.2 MG/ML IJ SOLN
0.2000 mg | INTRAMUSCULAR | Status: DC | PRN
  Filled 2017-10-21: qty 1

## 2017-10-21 MED ORDER — CALCIUM GLUCONATE 10 % IV SOLN
1.0000 g | Freq: Once | INTRAVENOUS | Status: AC
  Administered 2017-10-21: 1 g via INTRAVENOUS
  Filled 2017-10-21: qty 10

## 2017-10-21 MED ORDER — LORAZEPAM 2 MG/ML IJ SOLN: 1.0000 mg | INTRAMUSCULAR | Status: DC | PRN

## 2017-10-21 MED ORDER — SODIUM CHLORIDE 0.9 % IV SOLN
3.0000 g | Freq: Every day | INTRAVENOUS | Status: DC
  Filled 2017-10-21: qty 3

## 2017-10-21 MED ORDER — ONDANSETRON HCL 4 MG/2ML IJ SOLN: 4.0000 mg | Freq: Four times a day (QID) | INTRAMUSCULAR | Status: DC | PRN

## 2017-10-21 MED ORDER — BIOTENE DRY MOUTH MT LIQD: 15.0000 mL | OROMUCOSAL | Status: DC | PRN

## 2017-10-21 MED ORDER — INSULIN ASPART 100 UNIT/ML IV SOLN
10.0000 [IU] | Freq: Once | INTRAVENOUS | Status: AC
  Administered 2017-10-21: 10 [IU] via INTRAVENOUS
  Filled 2017-10-21: qty 0.1

## 2017-10-21 MED ORDER — LORAZEPAM 2 MG/ML IJ SOLN: 1.0000 mg | Freq: Every day | INTRAMUSCULAR | Status: DC

## 2017-10-21 MED ORDER — DEXTROSE 50 % IV SOLN
1.0000 | Freq: Once | INTRAVENOUS | Status: AC
  Administered 2017-10-21: 50 mL via INTRAVENOUS
  Filled 2017-10-21: qty 50

## 2017-10-22 LAB — BODY FLUID CULTURE: Culture: NO GROWTH

## 2017-10-24 LAB — CULTURE, BLOOD (ROUTINE X 2)
CULTURE: NO GROWTH
Culture: NO GROWTH
Special Requests: ADEQUATE

## 2017-10-27 NOTE — Progress Notes (Signed)
[facility]  Daily Progress Note   Subjective  - 1 Day Post-Op  Patient confused, can provide little history Renal function has declined.  HR low and oxygenation poor.  Objective Vitals:   11-02-2017 0655 2017-11-02 0700 November 02, 2017 0800 11/02/2017 0900  BP:  (!) 125/35 (!) 135/36 (!) 127/53  Pulse:  62 75 68  Resp:  15 20 (!) 27  Temp:   97.6 F (36.4 C)   TempSrc:   Axillary   SpO2: (!) 89% (!) 89% 91% 90%  Weight:      Height:        Intake/Output Summary (Last 24 hours) at Nov 02, 2017 0947 Last data filed at 11/02/2017 0900 Gross per 24 hour  Intake 304.6 ml  Output 0 ml  Net 304.6 ml    PULM  Diminished bilaterally CV  Irregular and bradycardic VASC  Right foot is warm now, good capillary refill.  Left foot is cooler with sluggish capillary refill.  No access site bleeding.   Laboratory CBC    Component Value Date/Time   WBC 33.0 (H) 11-02-17 0618   HGB 8.4 (L) 11/02/2017 0618   HCT 27.7 (L) 02-Nov-2017 0618   PLT 484 (H) 2017/11/02 0618    BMET    Component Value Date/Time   NA 139 2017-11-02 0618   K 6.0 (H) 11/02/17 0618   CL 97 (L) 11-02-2017 0618   CO2 27 2017-11-02 0618   GLUCOSE 128 (H) 11-02-17 0618   BUN 64 (H) 11/02/2017 0618   CREATININE 2.43 (H) 11-02-2017 0618   CALCIUM 7.4 (L) 11/02/17 0618   GFRNONAA 17 (L) Nov 02, 2017 0618   GFRAA 19 (L) 2017/11/02 0618    Assessment/Planning: POD #1 s/p extensive RLE revascularization   Right foot perfusion is markedly improved.  Left foot perfusion appears marginal.  She has a litany of ongoing issues which are clearly immediately life-threatening including acute MI and renal failure.  Her cardiac function is very poor.  Her overall prognosis is very poor at this point.  No left leg intervention would be planned at this time given her overall status and the fact that she would clearly develop worsening renal failure with any further contrast load.  She is not really  complaining of left leg pain like she was right leg pain.  Her right foot pain seems to be markedly improved.  Would continue heparin if tolerated    Leotis Pain  02-Nov-2017, 9:47 AM

## 2017-10-27 NOTE — Progress Notes (Signed)
Mallory Rios: Mallory Rios    MR#:  [id number]  DATE OF BIRTH:  October 27, 1927  SUBJECTIVE:  CHIEF COMPLAINT:   Chief Complaint  Patient presents with  . Shortness of Breath   The patient is unresponsive to stimuli.  REVIEW OF SYSTEMS:  Review of Systems  Unable to perform ROS: Critical illness    DRUG ALLERGIES:   Allergies  Allergen Reactions  . Demerol [Meperidine]   . Other Other (See Comments)    darvocet   . Propoxyphene    VITALS:  Blood pressure (!) 108/22, pulse 64, temperature (!) 97.5 F (36.4 C), temperature source Axillary, resp. rate 16, height 4\' 8"  (1.422 m), weight 119 lb (54 kg), SpO2 100 %. PHYSICAL EXAMINATION:  Physical Exam  HENT:  Head: Normocephalic.  Eyes: Pupils are equal, round, and reactive to light. Conjunctivae and EOM are normal. No scleral icterus.  Neck: Normal range of motion. Neck supple. No JVD present. No tracheal deviation present.  Cardiovascular: Normal rate and normal heart sounds. Exam reveals no gallop.  No murmur heard. Irregular rate and rhythm.  No pedal pulses on right foot.  Pulmonary/Chest: Effort normal. No respiratory distress. She has no wheezes. She has rales.  Abdominal: Soft. Bowel sounds are normal. She exhibits no distension. There is no tenderness. There is no rebound.  Musculoskeletal: Normal range of motion. She exhibits no edema or tenderness.  It is cold on right foot and lower part of leg  Neurological: She is alert. No cranial nerve deficit.  Skin: No rash noted. No erythema.   LABORATORY PANEL:  Female CBC Recent Labs  Lab Nov 02, 2017 0618  WBC 33.0*  HGB 8.4*  HCT 27.7*  PLT 484*   ------------------------------------------------------------------------------------------------------------------ Chemistries  Recent Labs  Lab 02-Nov-2017 0618  NA 139  K 6.0*  CL 97*  CO2 27  GLUCOSE 128*  BUN 64*  CREATININE 2.43*  CALCIUM 7.4*  MG  2.7*   RADIOLOGY:  Dg Chest 1 View  Result Date: November 02, 2017 CLINICAL DATA:  Dyspnea EXAM: CHEST  1 VIEW COMPARISON:  10/09/2017 FINDINGS: Cardiac enlargement. No vascular congestion or edema. Small right pleural effusion with basilar atelectasis. Visualization is limited due to positioning. No pneumothorax. No focal consolidation. Calcification of the aorta. IMPRESSION: Cardiac enlargement. Small right pleural effusion with basilar atelectasis. Electronically Signed   By: Mallory Rios M.D.   On: 2017-11-02 01:26   ASSESSMENT AND PLAN:  Mallory Rios  is a 84 y.o. female with a known history of A. fib, CHF, hypertension, myocardial infarction, recent admission to [facility] 2 weeks ago for pneumonia and anasarca-[facility]  She was more short of breath worsening for the last few days, so was sent back to emergency room.  *Acute respiratory failure with hypoxia due to CHF and pleural effusion. As per discharge summary from [facility] 2 weeks ago, patient was discharged on 2 L oxygen after treated for pneumonia. She was on BiPAP, changed to oxygen by nasal cannula.   CT scan on chest also appears to have large right-sided pleural effusion with some atelectasis. S/P  Thoracentesis with 1.2 L fluid draw.  *Large right-sided pleural effusion Most likely parapneumonic as she was treated for pneumonia 2 weeks ago. S/P  Thoracentesis with 1.2 L fluid draw.  * Ac on Chronic diastolic congestive heart failure She was on IV Lasix and CHF protocol.  *Non-STEMI, troponin is up to 4.81. She  was on heparin drip.  *Chronic microcytic anemia  *History of CAD She was on aspirin, Lipitor.  History of chronic A. fib.  Rate is controlled. Continue Lopressor and heparin drip.  Acute ischemia to the right lower extremity. Per Dr. Lucky Rios, this is almost certainly a cardiac embolus secondary to A. fib.  Status posterior angiogram and vascular procedures.  She was  on heparin drip.  Very poor prognosis, on comfort care. All the records are reviewed and case discussed with Care Management/Social Worker. Management plans discussed with the patient, and her husband and they are in agreement.  CODE STATUS: DNR  TOTAL TIME TAKING CARE OF THIS PATIENT: 23 minutes.   More than 50% of the time was spent in counseling/coordination of care: YES  POSSIBLE D/C IN ? DAYS, DEPENDING ON CLINICAL CONDITION.   Mallory Rios M.D on 2017/11/19 at 2:10 PM  Between 7am to 6pm - Pager - [phone]  After 6pm go to [email or web address] - [place] [facility]

## 2017-10-27 NOTE — Progress Notes (Signed)
Report given to [facility] on 1C, pt transported by writing RN and NT to room 105 with 4 liters [place] via tank.  Chart sent with her, pt with no UOP this shift, Amanda aware, 180 in bladder per scan.  Pt in no distress when writer left 105.

## 2017-10-27 NOTE — Progress Notes (Signed)
During rounding noted that pt was without respirations and pulse, pronounced by self and Jaymes Graff RN, Dr Bridgett Larsson and [facility] aware, daughter Aram Beecham made aware and states [facility] home

## 2017-10-27 NOTE — Discharge Summary (Addendum)
   [facility] [place] NAME: Mallory Rios    MR#:  [id number]  DATE OF BIRTH:  09/10/1927  DATE OF ADMISSION:  09/30/2017   ADMITTING PHYSICIAN: Vaughan Basta, MD  DATE OF DISCHARGE: 11/07/2017     PRIMARY CARE PHYSICIAN: Juluis Pitch, MD   ADMISSION DIAGNOSIS:  Pleural effusion [J90] S/P thoracentesis [Z98.890] NSTEMI (non-ST elevated myocardial infarction) ([name]) [I21.4] Acute hypoxemic respiratory failure (HCC) [J96.01] DISCHARGE DIAGNOSIS:  Principal Problem:   Acute on chronic respiratory failure with hypoxia (HCC) Active Problems:   Pleural effusion   NSTEMI (non-ST elevated myocardial infarction) [facility])   Terminal care   Palliative care by specialist   Advance care planning   Goals of care, counseling/discussion   Dyspnea  SECONDARY DIAGNOSIS:   Past Medical History:  Diagnosis Date  . A-fib ([place])   . Cancer ([place])   . CHF (congestive heart failure) ([place])   . Hypertension   . MI (myocardial infarction) [facility])    HOSPITAL COURSE:   CatherineSykesis a89 y.o.femalewith a known history of A. fib, CHF, hypertension, myocardial infarction, recent admission to [facility] 2 weeks ago for pneumonia and anasarca-[facility].She was more short of breath worsening for the last few days, so was sent back to emergency room.  *Acute respiratory failure with hypoxia due to CHF and pleural effusion. As per discharge summary from [facility] 2 weeks ago, patient was discharged on 2 L oxygen after treated for pneumonia. She was on BiPAP, changed to oxygen by nasal cannula.   CT scan on chest also appears to have large right-sided pleural effusion with some atelectasis. S/P  Thoracentesis with 1.2 L fluid draw.  *Large right-sided pleural effusion Most likely parapneumonic as she was treated for pneumonia 2 weeks ago. S/P  Thoracentesis with 1.2 L fluid draw.  * Ac onChronic  diastolic congestive heart failure She was on IV Lasix and CHF protocol.  *Non-STEMI, troponin is up to 4.81. She was on heparin drip.  *Chronic microcytic anemia  *History of CAD She was on aspirin, Lipitor.  History of chronic A. fib.  Rate is controlled. Continue Lopressor and heparin drip.  Acute ischemia to the right lower extremity. Per Dr. Lucky Cowboy, this is almost certainly a cardiac embolus secondary to A. fib.  Status posterior angiogram and vascular procedures.She was on heparin drip.  Very poor prognosis, on comfort care.  The patient passed away at 17:30.  [name] M.D on 07-Nov-2017 at 5:41 PM  Between 7am to 6pm - Pager - [phone]  After 6pm go to [email or web address] - [employer]  [facility] [place] [facility]  Office  [phone]  CC: Primary care physician; Juluis Pitch, MD   Note: This dictation was prepared with Dragon dictation along with smaller phrase technology. Any transcriptional errors that result from this process are unintentional.

## 2017-10-27 NOTE — Progress Notes (Signed)
* [facility]      Date: 30-Oct-2017  MRN# [id number] Mallory Rios [birth year]   Mallory Rios is a 84 y.o. old female seen in follow up for chief complaint of  Chief Complaint  Patient presents with  . Shortness of Breath   Patient Summary  Initially Patient admitted for SOB, pleural effusion Subsequently developed acute lower ext ischemic-vasc surgery consulted-thrombectomy/angioplasty transferred to ICU for progressive resp failure,severe hypoxia   Patient is DNR/DNI  Medication:    Current Facility-Administered Medications:  .  acetaminophen (TYLENOL) tablet 650 mg, 650 mg, Oral, Q6H PRN, Algernon Huxley, MD, 650 mg at 09/30/2017 2138 .  aspirin EC tablet 81 mg, 81 mg, Oral, Daily, Algernon Huxley, MD, 81 mg at 10/13/2017 0942 .  atorvastatin (LIPITOR) tablet 40 mg, 40 mg, Oral, q1800, Algernon Huxley, MD, 40 mg at 10/10/2017 2138 .  docusate sodium (COLACE) capsule 100 mg, 100 mg, Oral, BID PRN, Algernon Huxley, MD .  famotidine (PEPCID) tablet 10 mg, 10 mg, Oral, BID, Dew, Erskine Squibb, MD, Stopped at 10/14/2017 2149 .  ferrous sulfate tablet 325 mg, 325 mg, Oral, Q breakfast, Dew, Erskine Squibb, MD, 325 mg at 30-Oct-2017 0755 .  furosemide (LASIX) injection 20 mg, 20 mg, Intravenous, Q12H, Dew, Erskine Squibb, MD, 20 mg at 09/29/2017 2156 .  heparin ADULT infusion 100 units/mL (25000 units/274mL sodium chloride 0.45%), 1,000 Units/hr, Intravenous, Continuous, Dew, Erskine Squibb, MD, Last Rate: 10 mL/hr at 10/30/2017 1000, 1,000 Units/hr at Oct 30, 2017 1000 .  ipratropium-albuterol (DUONEB) 0.5-2.5 (3) MG/3ML nebulizer solution 3 mL, 3 mL, Nebulization, Q4H PRN, Algernon Huxley, MD, 3 mL at 10/08/2017 0905 .  metoprolol tartrate (LOPRESSOR) tablet 12.5 mg, 12.5 mg, Oral, BID, Dew, Erskine Squibb, MD, Stopped at 10/11/2017 2151 .  mirtazapine (REMERON) tablet 7.5 mg, 7.5 mg, Oral, QHS, Dew, Erskine Squibb, MD, Stopped at 09/26/2017 2151 .  morphine 2 MG/ML injection 1-2 mg, 1-2 mg, Intravenous, Q4H PRN, Algernon Huxley, MD, 1  mg at Oct 30, 2017 0854 .  naloxone [facility]) injection 0.4 mg, 0.4 mg, Intravenous, Once, [name], MD .  ondansetron [facility]) injection 4 mg, 4 mg, Intravenous, Q6H PRN, Algernon Huxley, MD, 4 mg at 10/08/2017 0904 .  oxyCODONE-acetaminophen (PERCOCET/ROXICET) 5-325 MG per tablet 1 tablet, 1 tablet, Oral, Q6H PRN, Lucky Cowboy, Erskine Squibb, MD .  sodium chloride flush (NS) 0.9 % injection 3 mL, 3 mL, Intravenous, Q12H, Dew, Erskine Squibb, MD, 3 mL at 10/30/2017 1000 .  sodium chloride flush (NS) 0.9 % injection 3 mL, 3 mL, Intravenous, Q12H, Dew, Erskine Squibb, MD, 3 mL at 30-Oct-2017 1000 .  sodium chloride flush (NS) 0.9 % injection 3 mL, 3 mL, Intravenous, PRN, Dew, Erskine Squibb, MD .  vitamin C (ASCORBIC ACID) tablet 500 mg, 500 mg, Oral, Daily, Dew, Erskine Squibb, MD, 500 mg at 10/08/2017 0944 .  vitamin E capsule 400 Units, 400 Units, Oral, Daily, Algernon Huxley, MD, 400 Units at 10/20/17 0944   Allergies:  Demerol [meperidine]; Other; and Propoxyphene   PHYSICAL EXAMINATION:  GENERAL:critically ill appearing, +resp distress, thin and frail HEAD: Normocephalic, atraumatic.  EYES: Pupils equal, round, reactive to light.  No scleral icterus.  MOUTH: Moist mucosal membrane. NECK: Supple. No thyromegaly. No nodules. No JVD.  PULMONARY: +rhonchi, +wheezing CARDIOVASCULAR: S1 and S2. Regular rate and rhythm. No murmurs, rubs, or gallops.  GASTROINTESTINAL: Soft, nontender, -distended. No masses. Positive bowel sounds. No hepatosplenomegaly.  MUSCULOSKELETAL: LE cold to touch, weak pulses NEUROLOGIC:  obtunded SKIN:intact,warm,dry  REVIEW OF SYSTEMS PATIENT IS UNABLE TO PROVIDE COMPLETE REVIEW OF SYSTEMS DUE TO SEVERE CRITICAL ILLNESS     LABORATORY PANEL:   CBC Recent Labs  Lab October 29, 2017 0618  WBC 33.0*  HGB 8.4*  HCT 27.7*  PLT 484*   ------------------------------------------------------------------------------------------------------------------  Chemistries  Recent Labs  Lab 29-Oct-2017 0618  NA 139  K 6.0*  CL 97*   CO2 27  GLUCOSE 128*  BUN 64*  CREATININE 2.43*  CALCIUM 7.4*  MG 2.7*   ------------------------------------------------------------------------------------------------------------------  Cardiac Enzymes Recent Labs  Lab 09/30/2017 1908  TROPONINI 4.81*       Assessment and Plan:  84 yo white female with multiple medical issues with severe Acute hypoxic respiratory failure  in setting Of severe ischemia to lower ext with CHF  Severe Hypoxic and Hypercapnic Respiratory Failure, patient's status is deteriorating, presently DO NOT RESUSCITATE/DO NOT INTUBATE, palliative consultation. Presently on nasal cannula oxygen.  Bradycardia. With hyperkalemia we'll give calcium, 2 A of bicarbonate, D50 and insulin.  Progressive renal insufficiency. Patient's admission creatinine was 0.72, now has increased to 2.43  Hyperkalemia. Treated, will recheck  Leukocytosis. Patient has a progressive increasing white count, will add Unasyn for presumptive aspiration chest x-ray revealing right lower lobe infiltrate/effusion  Anemia. No evidence of active bleeding  Lactic acidosis. Pending repeat value, anion gap has decreased to 15 and serum CO2 was up to 27  Patient is deteriorating, pending palliative care consultation and discussion with family regarding hospice  Hermelinda Dellen, DO  Patient ID: Mallory Rios, female   DOB: [birth year], 84 y.o.   MRN: [id number]

## 2017-10-27 NOTE — Consult Note (Signed)
Consultation Note Date: 10/31/17   Patient Name: Mallory Rios  DOB: 1928/02/13  MRN: [id number]  Age / Sex: 84 y.o., female  PCP: Juluis Pitch, MD Referring Physician: [name], MD  Reason for Consultation: Establishing goals of care  HPI/Patient Profile: 84 y.o. female  with past medical history of a fib, CHF, HTN, MI, recent hospitalization at [facility] for pneumonia and anasarca w d/c to [facility], admitted on 10/18/2017 with SOB, acute hypoxic respiratory failure due to CHF. During admission was also found to have ischemic R lower leg. Vascular surgery suspected ischemia due to cardiac embolus. Angioplasty and revascularization of lower extremities under sedation was attempted. Since procedure patient has had worsening respiratory failure and hypoxia, increasing creatinine, runs of V-tach. Palliative medicine consulted for [place].  Clinical Assessment and Goals of Care: Patient was in bed on NRB, opens eyes to my voice briefly. No family at bedside.  Spoke with spouse via telephone. He states he is unable to come into hospital but agreed to [facility] meeting via telephone.  Discussed patient's poor status and poor prognosis. Mallory Rios stated he "had it on his mind that she wasn't going to get better". He stated he "just doesn't want her to suffer".  We discussed continued aggressive medical care vs transition to comfort measures only.  Mallory Rios in agreement that transition to comfort would best align with patient's [place]. She has had significant decline since going to [facility]. He states she had four good years with him at home and he was glad he got to take care of her there.  Patient's "daughter"/friend- Mallory Rios arrived at bedside after my discussion with Mallory Rios. She agrees that comfort measures best align with GOC for patient.   Primary Decision Maker NEXT OF KIN- patient's spouse- Mallory Rios    SUMMARY OF  RECOMMENDATIONS -Transition to full comfort care -D/C NRB- transition to nasal cannula -Comfort medications as ordered -Transfer out of ICU    Code Status/Advance Care Planning:  DNR   Palliative Prophylaxis:   Frequent Pain Assessment  Additional Recommendations (Limitations, Scope, Preferences):  Full Comfort Care  Prognosis:    Hours - Days  Discharge Planning: Anticipated Hospital Death  Primary Diagnoses: Present on Admission: **None**   I have reviewed the medical record, interviewed the patient and family, and examined the patient. The following aspects are pertinent.  Past Medical History:  Diagnosis Date  . A-fib ([place])   . Cancer ([place])   . CHF (congestive heart failure) ([place])   . Hypertension   . MI (myocardial infarction) [facility])    Social History   Socioeconomic History  . Marital status: Married    Spouse name: Not on file  . Number of children: Not on file  . Years of education: Not on file  . Highest education level: Not on file  Occupational History  . Occupation: retired  Scientific laboratory technician  . Financial resource strain: Not on file  . Food insecurity:    Worry: Not on file    Inability: Not on  file  . Transportation needs:    Medical: Not on file    Non-medical: Not on file  Tobacco Use  . Smoking status: Never Smoker  . Smokeless tobacco: Never Used  Substance and Sexual Activity  . Alcohol use: No  . Drug use: No  . Sexual activity: Not on file  Lifestyle  . Physical activity:    Days per week: Not on file    Minutes per session: Not on file  . Stress: Not on file  Relationships  . Social connections:    Talks on phone: Not on file    Gets together: Not on file    Attends religious service: Not on file    Active member of club or organization: Not on file    Attends meetings of clubs or organizations: Not on file    Relationship status: Not on file  Other Topics Concern  . Not on file  Social History Narrative  . Not on file     Family History  Problem Relation Age of Onset  . CAD Neg Hx   . Diabetes Neg Hx   . Hypertension Neg Hx    Scheduled Meds: . aspirin EC  81 mg Oral Daily  . atorvastatin  40 mg Oral q1800  . famotidine  10 mg Oral BID  . ferrous sulfate  325 mg Oral Q breakfast  . furosemide  20 mg Intravenous Q12H  . metoprolol tartrate  12.5 mg Oral BID  . mirtazapine  7.5 mg Oral QHS  . naLOXone (NARCAN)  injection  0.4 mg Intravenous Once  . sodium chloride flush  3 mL Intravenous Q12H  . sodium chloride flush  3 mL Intravenous Q12H  . vitamin C  500 mg Oral Daily  . vitamin E  400 Units Oral Daily   Continuous Infusions: . heparin 1,000 Units/hr (11-17-17 1000)   PRN Meds:.acetaminophen, docusate sodium, ipratropium-albuterol, morphine injection, ondansetron (ZOFRAN) IV, oxyCODONE-acetaminophen, sodium chloride flush Medications Prior to Admission:  Prior to Admission medications   Medication Sig Start Date End Date Taking? Authorizing Provider  Ascorbic Acid (VITAMIN C) 500 MG CAPS Take 500 mg by mouth daily.   Yes [provider]  aspirin 81 MG EC tablet Take 81 mg by mouth daily. 09/09/09  Yes [provider]  atorvastatin (LIPITOR) 40 MG tablet Take 40 mg by mouth daily at 6 PM.  02/11/16  Yes [provider]  docusate sodium (COLACE) 100 MG capsule Take 100 mg by mouth daily as needed for mild constipation.   Yes [provider]  ferrous sulfate 325 (65 FE) MG tablet Take 325 mg by mouth daily with breakfast.   Yes [provider]  furosemide (LASIX) 20 MG tablet Take 1 tablet (20 mg total) by mouth daily. Patient taking differently: Take 20 mg by mouth 2 (two) times daily.  03/11/16 07/23/21 Yes Nance Pear, MD  ipratropium-albuterol (DUONEB) 0.5-2.5 (3) MG/3ML SOLN Take 3 mLs by nebulization every 4 (four) hours as needed.   Yes [provider]  metoprolol tartrate (LOPRESSOR) 25 MG tablet Take 0.5 tablets (12.5 mg total) by  mouth 2 (two) times daily. 06/05/16  Yes Fritzi Mandes, MD  mirtazapine (REMERON) 7.5 MG tablet Take 7.5 mg by mouth at bedtime.   Yes [provider]  ranitidine (ZANTAC) 75 MG tablet Take 75 mg by mouth 2 (two) times daily.   Yes [provider]  vitamin E 400 UNIT capsule Take 400 Units by mouth daily.  Yes [provider]  acetaminophen (TYLENOL) 325 MG tablet Take 650 mg by mouth every 6 (six) hours as needed.    [provider]  famotidine (PEPCID) 20 MG tablet Take 1 tablet (20 mg total) by mouth at bedtime. Patient not taking: Reported on 10/10/2017 06/05/16   Fritzi Mandes, MD  nitroGLYCERIN (NITROSTAT) 0.4 MG SL tablet Place 0.4 mg under the tongue every 5 (five) minutes as needed for chest pain.    [provider]   Allergies  Allergen Reactions  . Demerol [Meperidine]   . Other Other (See Comments)    darvocet   . Propoxyphene    Review of Systems  Unable to perform ROS: Acuity of condition    Physical Exam  Constitutional:  Frail, ill appearing, brows furrowed  Cardiovascular:  Lower extremities purple, no pedal pulses  Pulmonary/Chest: Accessory muscle usage present. Tachypnea noted.  Musculoskeletal:  Pulling at mask, reaching up towards ceiling   Neurological:  Lethargic, no verbal responses  Nursing note and vitals reviewed.   Vital Signs: BP (!) 122/55   Pulse (!) 48   Temp 97.6 F (36.4 C) (Axillary)   Resp (!) 29   Ht 4\' 8"  (1.422 m)   Wt 54 kg (119 lb)   SpO2 (!) 81%   BMI 26.68 kg/m  Pain Scale: CPOT POSS *See Group Information*: 2-Acceptable,Slightly drowsy, easily aroused Pain Score: Asleep   SpO2: SpO2: (!) 81 % O2 Device:SpO2: (!) 81 % O2 Flow Rate: .O2 Flow Rate (L/min): 5 L/min  IO: Intake/output summary:   Intake/Output Summary (Last 24 hours) at 2017-11-14 1222 Last data filed at 11/14/17 1000 Gross per 24 hour  Intake 314.6 ml  Output 0 ml  Net 314.6 ml    LBM: Last BM Date:  10/13/2017 Baseline Weight: Weight: 50.8 kg (112 lb) Most recent weight: Weight: 54 kg (119 lb)     Palliative Assessment/Data: PPS: 10%     Thank you for this consult. Palliative medicine will continue to follow and assist as needed.   Time In: 1100 Time Out: 1300 Time Total: 120 mins Prolonged services billed: yes Greater than 50%  of this time was spent counseling and coordinating care related to the above assessment and plan.  Signed by: Mariana Kaufman, AGNP-C Palliative Medicine    Please contact Palliative Medicine Team phone at [phone] for questions and concerns.  For individual provider: See Shea Evans

## 2017-10-27 DEATH — deceased

## 2017-11-01 ENCOUNTER — Ambulatory Visit: Payer: Medicare Other | Admitting: Family

## 2018-01-17 IMAGING — CR DG CHEST 2V
2 series · 2 of 2 positions shown · non-contrast
Comparison: Portable chest x-ray February 10, 2011.

CLINICAL DATA: Nausea, vomiting, and cough for the past 2 weeks.
History of atrial fibrillation, 3 previous MIs, coronary artery
stent placement. Former smoker.

EXAM:
CHEST  2 VIEW

[chest lat]
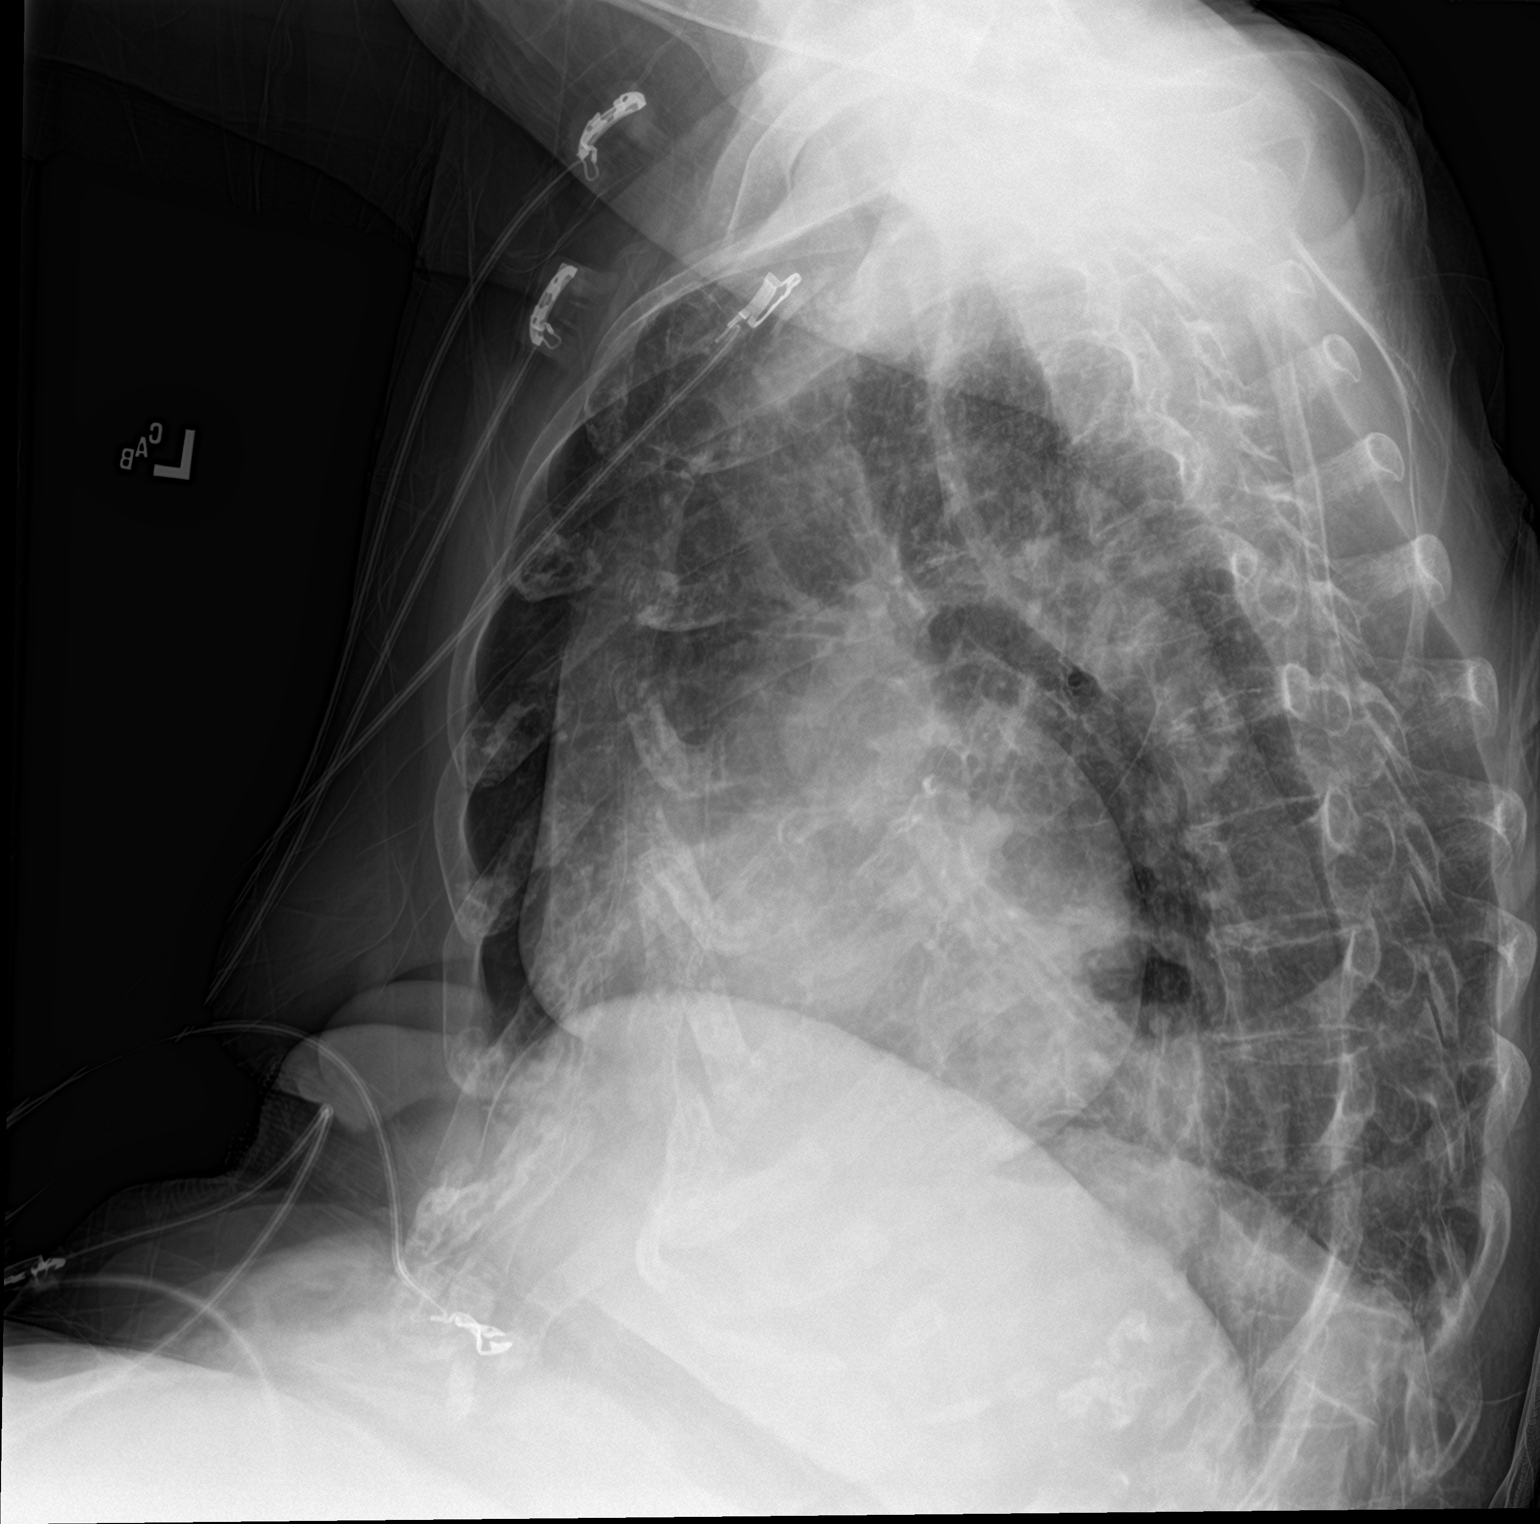

[chest ap]
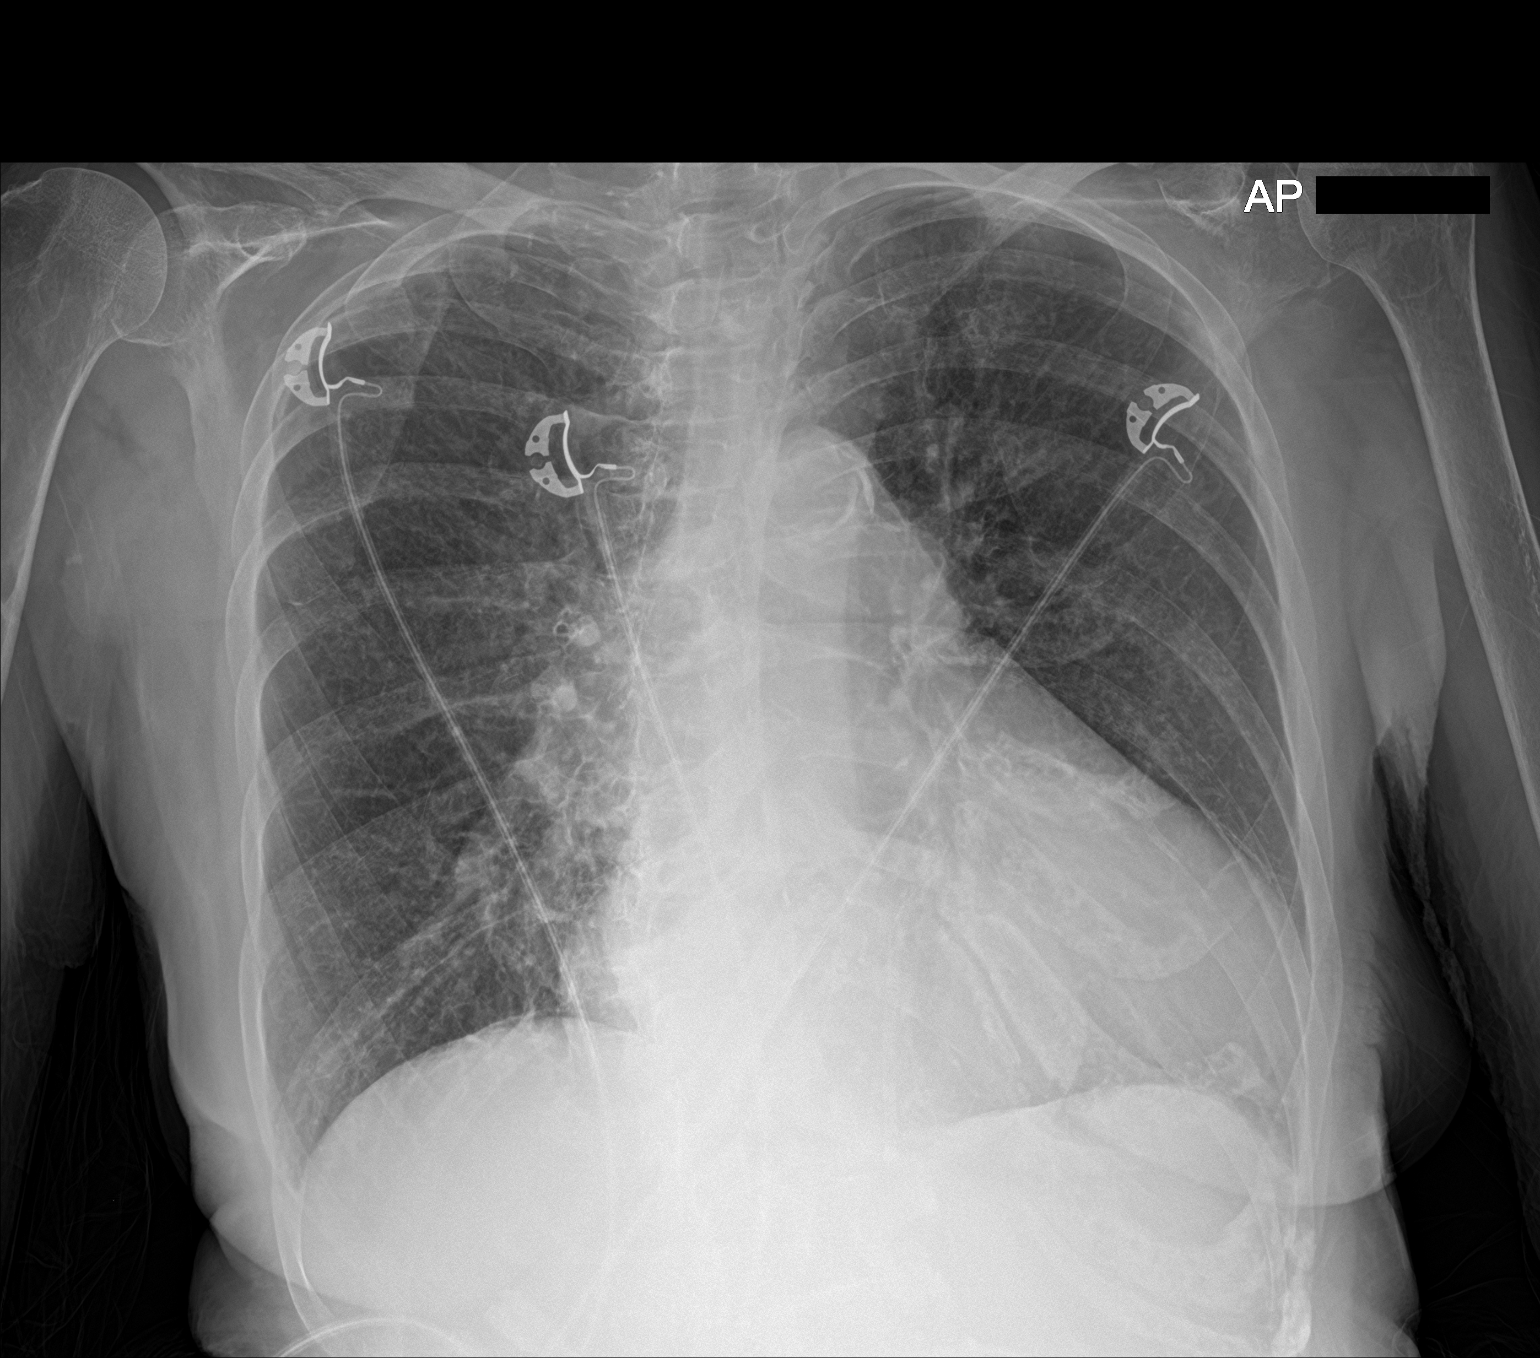

[2 of 2 positions shown; findings below may reference images not displayed]

FINDINGS: The lungs are mildly hyperinflated and clear. There is no pleural
effusion. The cardiac silhouette is enlarged. The pulmonary
vascularity is not engorged. There is calcification in the wall of
the thoracic aorta. The mediastinum is normal in width. There is a
probable hiatal hernia. The bony thorax is unremarkable.
IMPRESSION: Cardiomegaly without pulmonary edema. No pneumonia or pleural
effusion or other acute cardiopulmonary abnormality.

Thoracic aortic atherosclerosis.

## 2019-06-09 IMAGING — DX DG CHEST 1V PORT
1 series · 1 of 1 positions shown · non-contrast
Comparison: 06/01/2016

CLINICAL DATA: Exertional dyspnea

EXAM:
PORTABLE CHEST 1 VIEW

[chest ap]
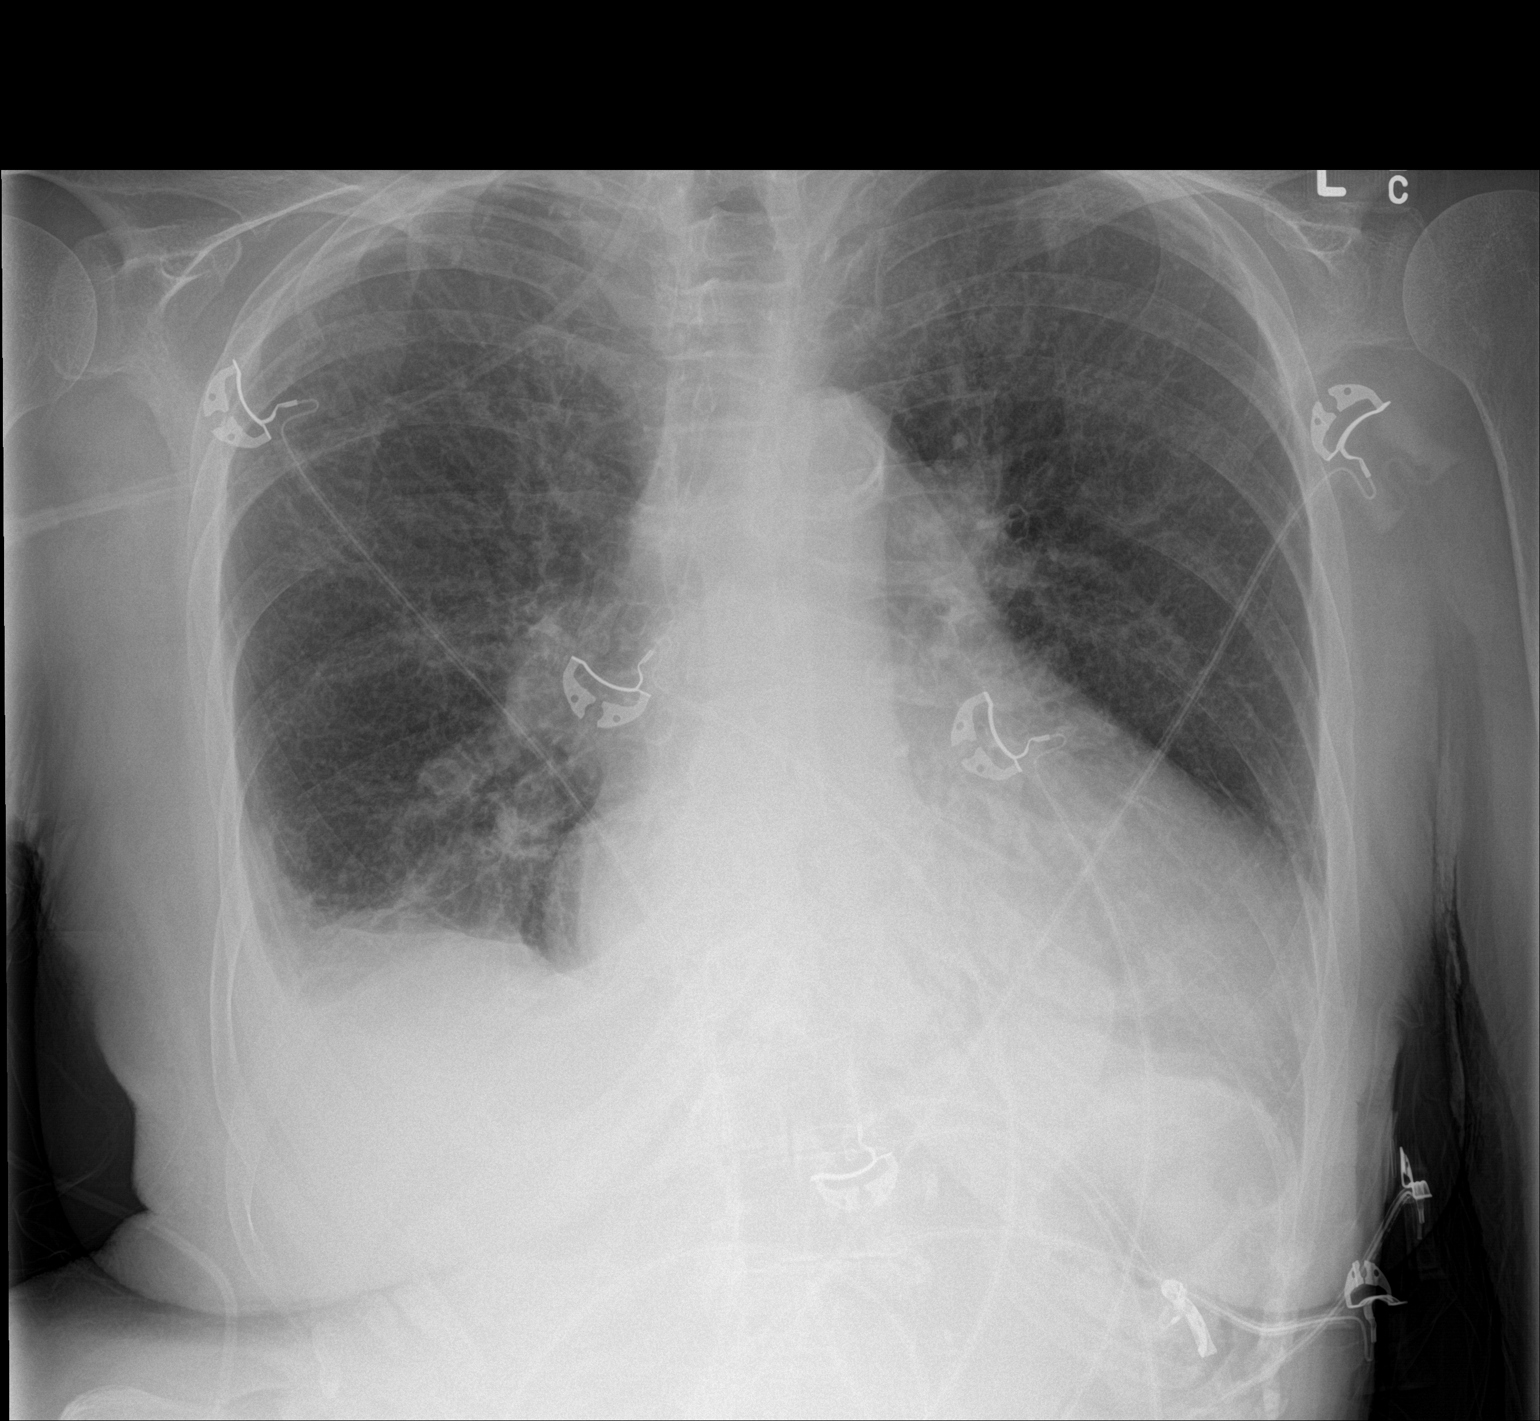

[1 of 1 positions shown; findings below may reference images not displayed]

FINDINGS: Stable cardiomegaly with moderate aortic atherosclerosis at the
arch. No aneurysm. Double density over the cardiac silhouette
consistent with a moderate to large hiatal hernia. Moderate right
effusion with blunting of the costophrenic angles. Mild diffuse
interstitial edema is seen. No acute osseous abnormality.
IMPRESSION: Cardiomegaly with mild interstitial edema and small right pleural
effusion. Aortic atherosclerosis without aneurysm. Hiatal hernia.

## 2019-09-05 IMAGING — DX DG CHEST 1V PORT
1 series · 1 of 1 positions shown · non-contrast
Comparison: 07/23/2017

CLINICAL DATA: Respiratory distress.

EXAM:
PORTABLE CHEST 1 VIEW

[chest ap]
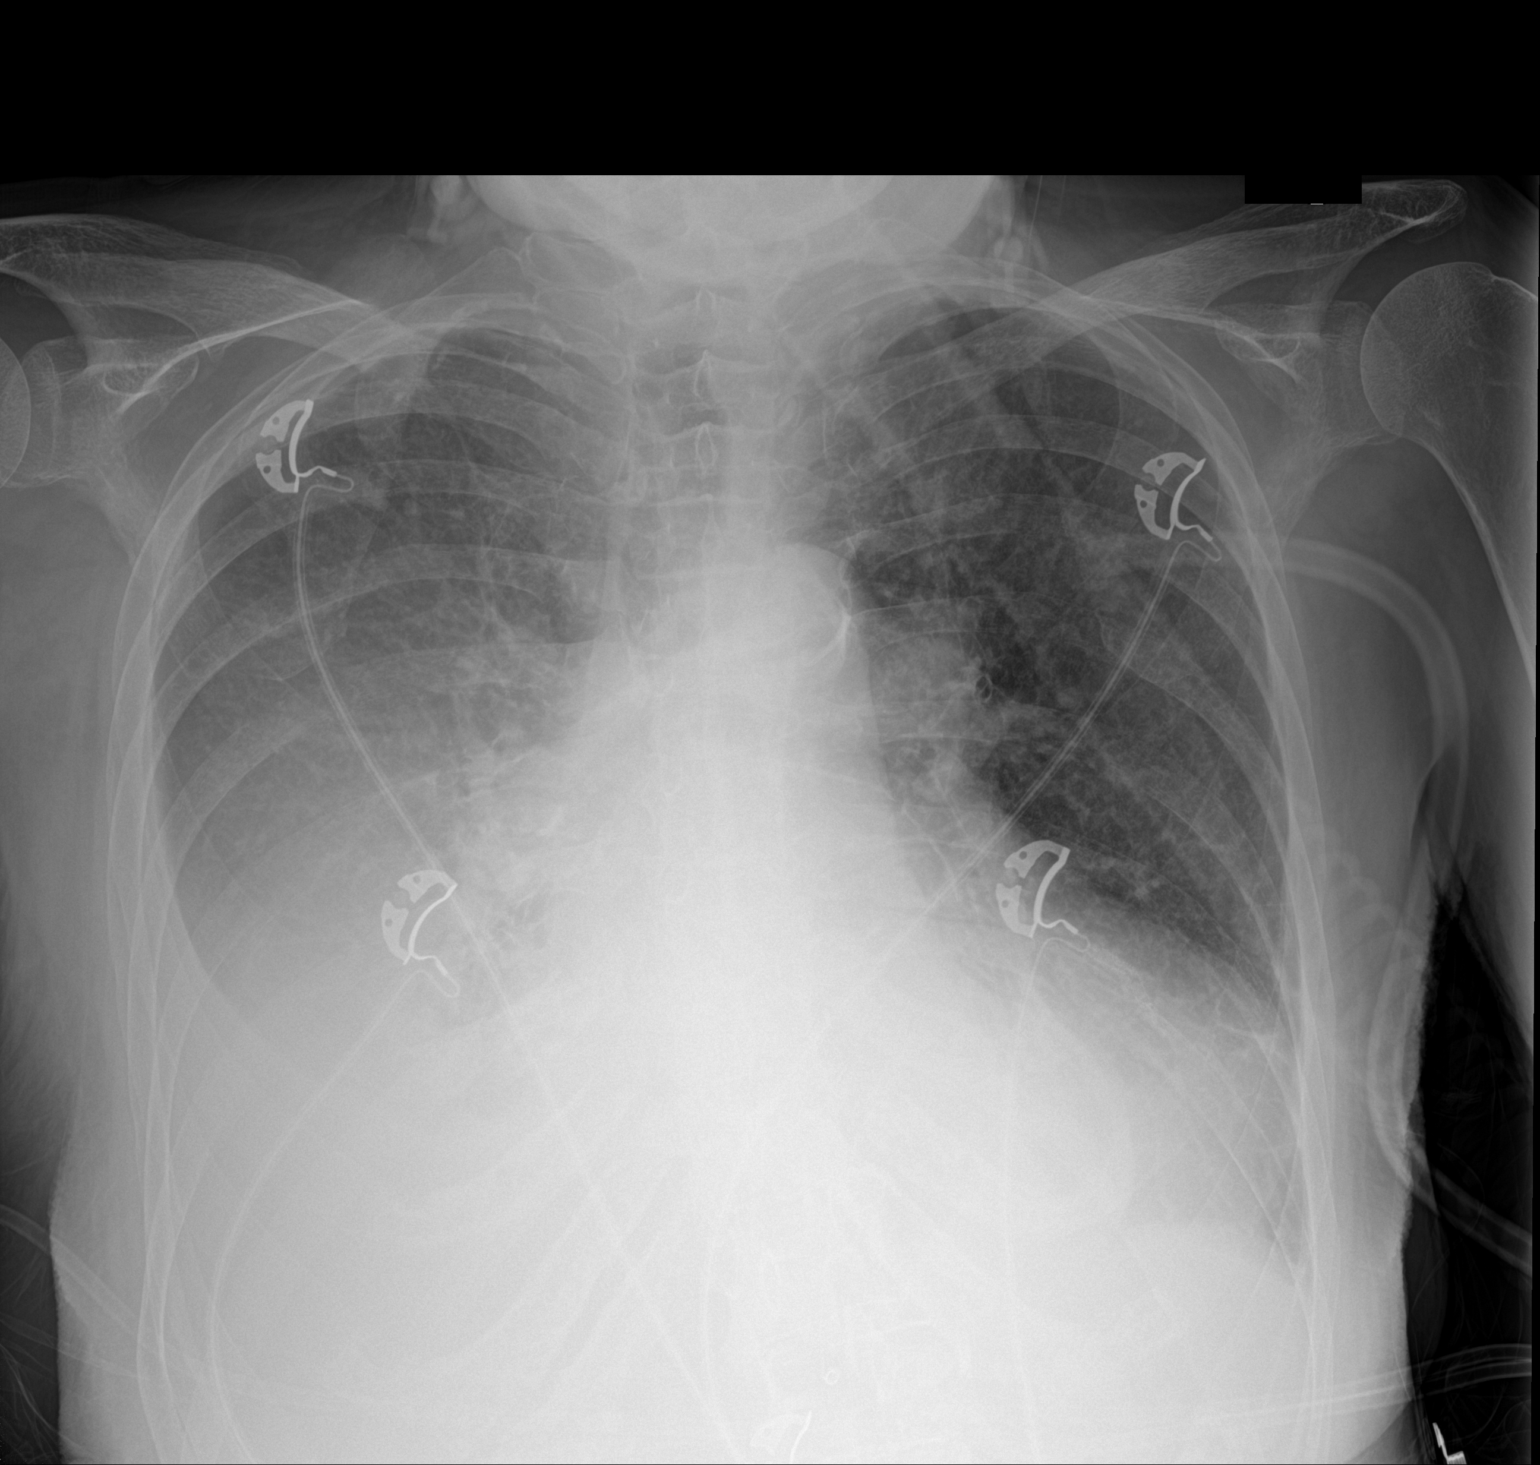

[1 of 1 positions shown; findings below may reference images not displayed]

FINDINGS: Cardiac enlargement. Moderate to large right pleural effusion with
basilar consolidation. This may indicate pneumonia. Left lung is
clear. No pneumothorax. Calcification of the aorta. Mediastinal
contours appear intact.
IMPRESSION: Moderate to large right pleural effusion with consolidation in the
right lung base possibly representing pneumonia. Cardiac
enlargement.

## 2019-09-05 IMAGING — US US THORACENTESIS ASP PLEURAL SPACE W/IMG GUIDE
1 series · 4 of 4 positions shown · non-contrast
Comparison: none

INDICATION: Right pleural effusion

[Series 1: us thoracentesis asp pleural space w/img guide · 4 of 4 slices shown]
[im 1/4]
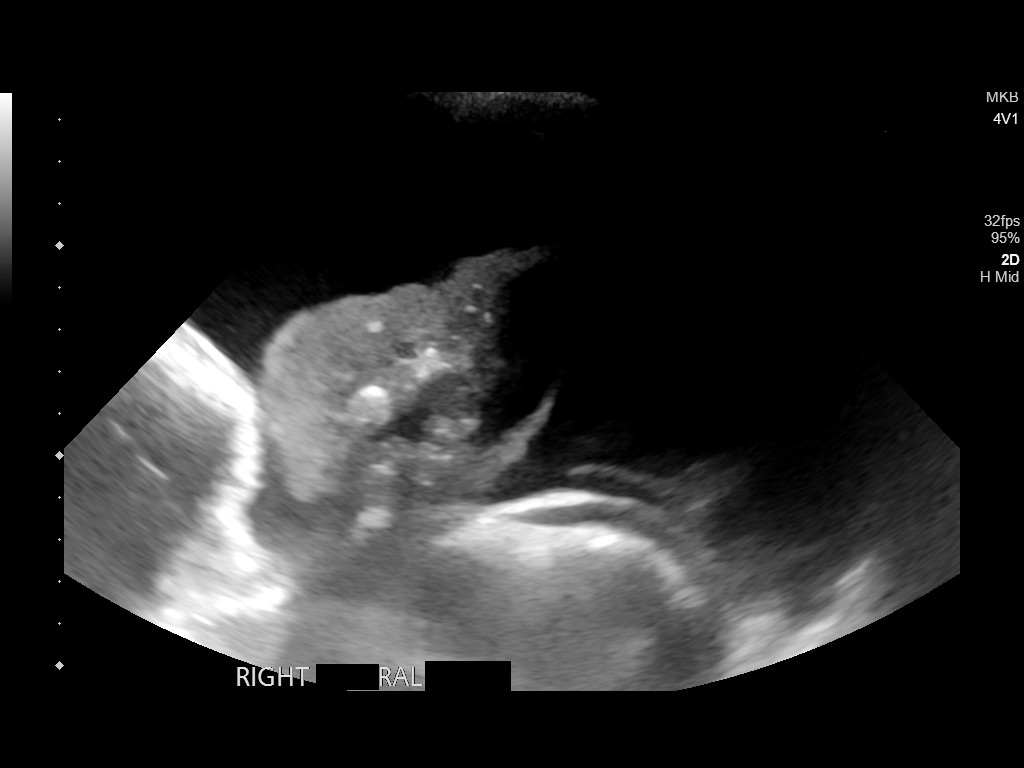
[im 2/4]
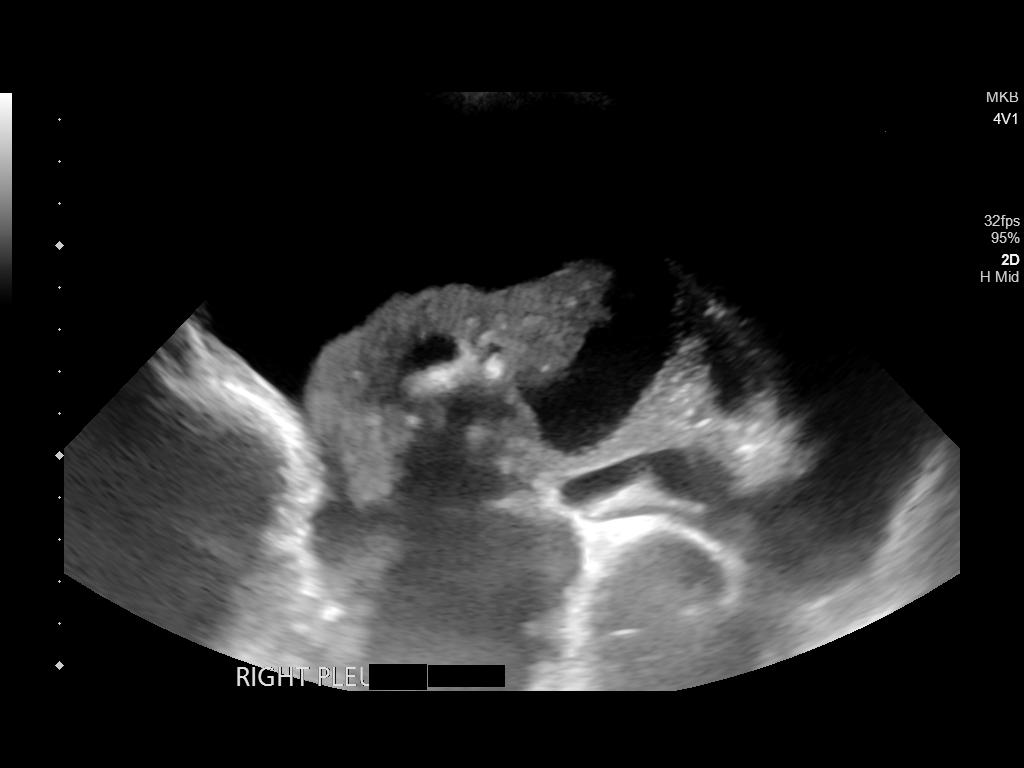
[im 3/4]
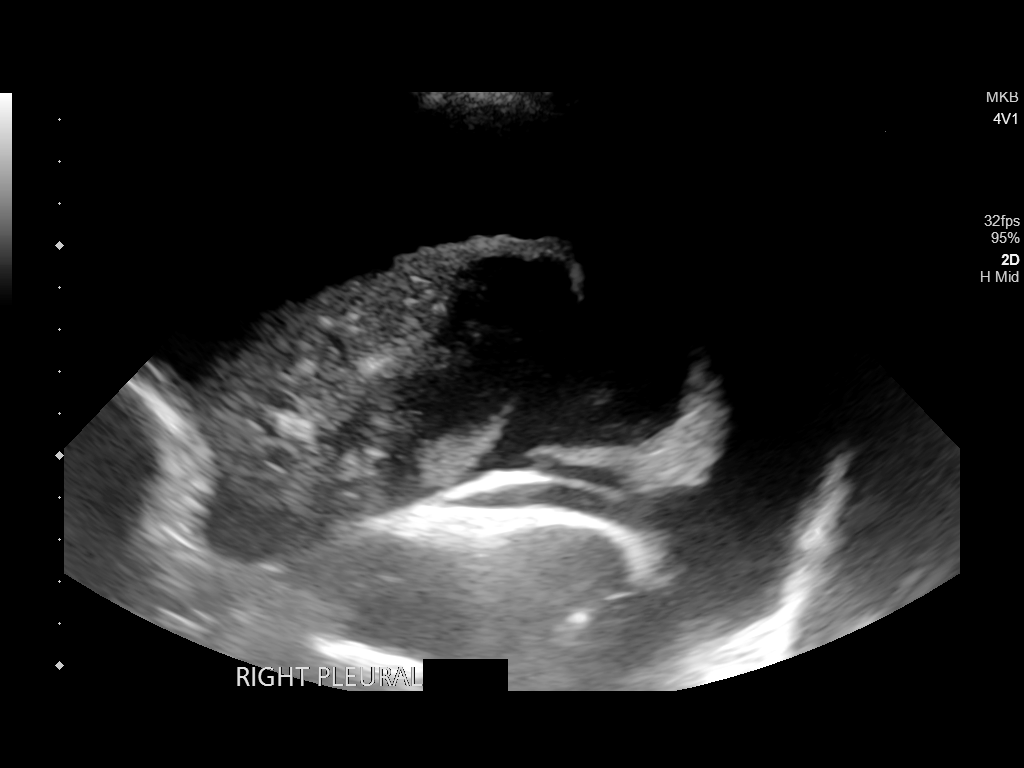
[im 4/4]
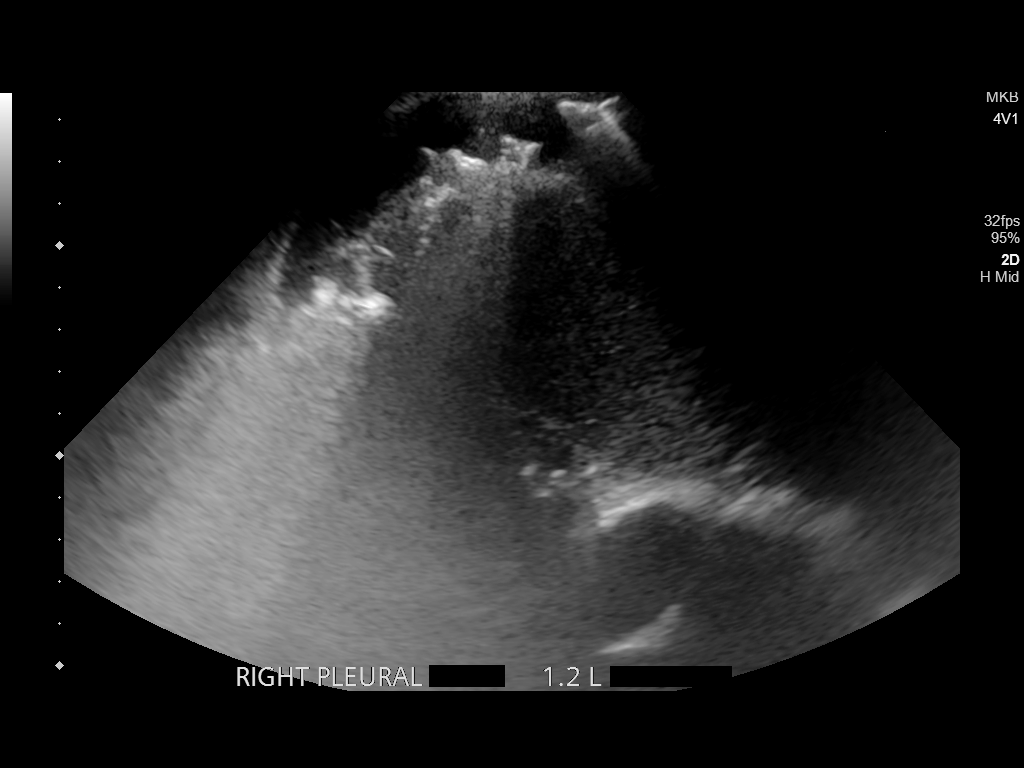

[4 of 4 positions shown; findings below may reference images not displayed]

EXAM:
ULTRASOUND GUIDED RIGHT THORACENTESIS

MEDICATIONS:
None.

COMPLICATIONS:
None immediate.

PROCEDURE:
An ultrasound guided thoracentesis was thoroughly discussed with the
patient and questions answered. The benefits, risks, alternatives
and complications were also discussed. The patient understands and
wishes to proceed with the procedure. Written consent was obtained.

Ultrasound was performed to localize and mark an adequate pocket of
fluid in the right chest. The area was then prepped and draped in
the normal sterile fashion. 1% Lidocaine was used for local
anesthesia. Under ultrasound guidance a 6 Fr Safe-T-Centesis
catheter was introduced. Thoracentesis was performed. The catheter
was removed and a dressing applied.
FINDINGS: A total of approximately 1.2 L of clear yellow fluid was removed.
Samples were sent to the laboratory as requested by the clinical
team.
IMPRESSION: Successful ultrasound guided right thoracentesis yielding 1.2 L of
pleural fluid.
